# Patient Record
Sex: Female | Born: 1950
Health system: Southern US, Community
[De-identification: ages and names within clinical notes are randomized; demographics above are authoritative.]

## PROBLEM LIST (undated history)

## (undated) DIAGNOSIS — R002 Palpitations: Secondary | ICD-10-CM

## (undated) DIAGNOSIS — I4892 Unspecified atrial flutter: Secondary | ICD-10-CM

## (undated) DIAGNOSIS — C50919 Malignant neoplasm of unspecified site of unspecified female breast: Secondary | ICD-10-CM

## (undated) HISTORY — PX: TONSILLECTOMY: SUR1361

## (undated) HISTORY — DX: Unspecified atrial flutter: I48.92

## (undated) HISTORY — DX: Malignant neoplasm of unspecified site of unspecified female breast: C50.919

## (undated) HISTORY — PX: DILATION AND CURETTAGE OF UTERUS: SHX78

---

## 1999-09-06 ENCOUNTER — Ambulatory Visit (HOSPITAL_BASED_OUTPATIENT_CLINIC_OR_DEPARTMENT_OTHER): Admission: RE | Admit: 1999-09-06 | Discharge: 1999-09-06 | Payer: Self-pay | Admitting: General Surgery

## 1999-09-06 ENCOUNTER — Encounter (INDEPENDENT_AMBULATORY_CARE_PROVIDER_SITE_OTHER): Payer: Self-pay | Admitting: *Deleted

## 1999-11-22 ENCOUNTER — Encounter: Payer: Self-pay | Admitting: Obstetrics and Gynecology

## 1999-11-22 ENCOUNTER — Encounter: Admission: RE | Admit: 1999-11-22 | Discharge: 1999-11-22 | Payer: Self-pay | Admitting: Obstetrics and Gynecology

## 1999-11-29 ENCOUNTER — Encounter: Payer: Self-pay | Admitting: General Surgery

## 1999-11-29 ENCOUNTER — Ambulatory Visit (HOSPITAL_BASED_OUTPATIENT_CLINIC_OR_DEPARTMENT_OTHER): Admission: RE | Admit: 1999-11-29 | Discharge: 1999-11-29 | Payer: Self-pay | Admitting: General Surgery

## 1999-11-29 ENCOUNTER — Encounter (INDEPENDENT_AMBULATORY_CARE_PROVIDER_SITE_OTHER): Payer: Self-pay | Admitting: *Deleted

## 2000-06-07 ENCOUNTER — Encounter: Payer: Self-pay | Admitting: General Surgery

## 2000-06-07 ENCOUNTER — Encounter: Admission: RE | Admit: 2000-06-07 | Discharge: 2000-06-07 | Payer: Self-pay | Admitting: General Surgery

## 2000-06-12 ENCOUNTER — Ambulatory Visit (HOSPITAL_BASED_OUTPATIENT_CLINIC_OR_DEPARTMENT_OTHER): Admission: RE | Admit: 2000-06-12 | Discharge: 2000-06-12 | Payer: Self-pay | Admitting: General Surgery

## 2000-06-12 ENCOUNTER — Encounter (INDEPENDENT_AMBULATORY_CARE_PROVIDER_SITE_OTHER): Payer: Self-pay | Admitting: Specialist

## 2000-06-12 ENCOUNTER — Encounter: Payer: Self-pay | Admitting: General Surgery

## 2000-11-27 ENCOUNTER — Encounter: Payer: Self-pay | Admitting: General Surgery

## 2000-11-27 ENCOUNTER — Encounter: Admission: RE | Admit: 2000-11-27 | Discharge: 2000-11-27 | Payer: Self-pay | Admitting: General Surgery

## 2001-12-10 ENCOUNTER — Encounter: Payer: Self-pay | Admitting: General Surgery

## 2001-12-10 ENCOUNTER — Encounter: Admission: RE | Admit: 2001-12-10 | Discharge: 2001-12-10 | Payer: Self-pay | Admitting: General Surgery

## 2002-02-04 ENCOUNTER — Encounter: Payer: Self-pay | Admitting: General Surgery

## 2002-02-04 ENCOUNTER — Ambulatory Visit (HOSPITAL_COMMUNITY): Admission: RE | Admit: 2002-02-04 | Discharge: 2002-02-04 | Payer: Self-pay | Admitting: General Surgery

## 2002-02-11 ENCOUNTER — Ambulatory Visit (HOSPITAL_COMMUNITY): Admission: RE | Admit: 2002-02-11 | Discharge: 2002-02-11 | Payer: Self-pay | Admitting: General Surgery

## 2002-02-11 ENCOUNTER — Encounter: Payer: Self-pay | Admitting: General Surgery

## 2002-12-23 ENCOUNTER — Encounter: Payer: Self-pay | Admitting: Obstetrics and Gynecology

## 2002-12-23 ENCOUNTER — Encounter: Admission: RE | Admit: 2002-12-23 | Discharge: 2002-12-23 | Payer: Self-pay | Admitting: Obstetrics and Gynecology

## 2004-01-12 ENCOUNTER — Encounter: Admission: RE | Admit: 2004-01-12 | Discharge: 2004-01-12 | Payer: Self-pay | Admitting: General Surgery

## 2004-11-01 ENCOUNTER — Encounter: Admission: RE | Admit: 2004-11-01 | Discharge: 2004-11-01 | Payer: Self-pay | Admitting: General Surgery

## 2005-11-07 ENCOUNTER — Encounter: Admission: RE | Admit: 2005-11-07 | Discharge: 2005-11-07 | Payer: Self-pay | Admitting: General Surgery

## 2006-11-20 ENCOUNTER — Encounter: Admission: RE | Admit: 2006-11-20 | Discharge: 2006-11-20 | Payer: Self-pay | Admitting: General Surgery

## 2008-01-01 ENCOUNTER — Encounter: Admission: RE | Admit: 2008-01-01 | Discharge: 2008-01-01 | Payer: Self-pay | Admitting: Obstetrics and Gynecology

## 2008-10-17 DIAGNOSIS — C50919 Malignant neoplasm of unspecified site of unspecified female breast: Secondary | ICD-10-CM

## 2008-10-17 HISTORY — DX: Malignant neoplasm of unspecified site of unspecified female breast: C50.919

## 2008-10-17 HISTORY — PX: BREAST SURGERY: SHX581

## 2009-01-05 ENCOUNTER — Encounter: Admission: RE | Admit: 2009-01-05 | Discharge: 2009-01-05 | Payer: Self-pay | Admitting: Obstetrics and Gynecology

## 2009-01-14 ENCOUNTER — Encounter: Admission: RE | Admit: 2009-01-14 | Discharge: 2009-01-14 | Payer: Self-pay | Admitting: General Surgery

## 2009-01-19 ENCOUNTER — Encounter (INDEPENDENT_AMBULATORY_CARE_PROVIDER_SITE_OTHER): Payer: Self-pay | Admitting: Radiology

## 2009-01-19 ENCOUNTER — Encounter: Admission: RE | Admit: 2009-01-19 | Discharge: 2009-01-19 | Payer: Self-pay | Admitting: General Surgery

## 2009-01-22 ENCOUNTER — Ambulatory Visit: Payer: Self-pay | Admitting: Genetic Counselor

## 2009-01-22 ENCOUNTER — Ambulatory Visit: Payer: Self-pay | Admitting: Oncology

## 2009-01-27 ENCOUNTER — Encounter: Admission: RE | Admit: 2009-01-27 | Discharge: 2009-01-27 | Payer: Self-pay | Admitting: Obstetrics and Gynecology

## 2009-02-02 ENCOUNTER — Encounter: Admission: RE | Admit: 2009-02-02 | Discharge: 2009-02-02 | Payer: Self-pay | Admitting: General Surgery

## 2009-02-04 ENCOUNTER — Encounter (INDEPENDENT_AMBULATORY_CARE_PROVIDER_SITE_OTHER): Payer: Self-pay | Admitting: General Surgery

## 2009-02-04 ENCOUNTER — Ambulatory Visit (HOSPITAL_BASED_OUTPATIENT_CLINIC_OR_DEPARTMENT_OTHER): Admission: RE | Admit: 2009-02-04 | Discharge: 2009-02-05 | Payer: Self-pay | Admitting: General Surgery

## 2009-02-11 LAB — COMPREHENSIVE METABOLIC PANEL
AST: 20 U/L (ref 0–37)
Albumin: 4 g/dL (ref 3.5–5.2)
BUN: 16 mg/dL (ref 6–23)
Calcium: 9.3 mg/dL (ref 8.4–10.5)
Chloride: 108 mEq/L (ref 96–112)
Creatinine, Ser: 0.66 mg/dL (ref 0.40–1.20)
Glucose, Bld: 118 mg/dL — ABNORMAL HIGH (ref 70–99)
Potassium: 3.9 mEq/L (ref 3.5–5.3)

## 2009-02-11 LAB — CBC WITH DIFFERENTIAL/PLATELET
Basophils Absolute: 0 10*3/uL (ref 0.0–0.1)
EOS%: 1.6 % (ref 0.0–7.0)
Eosinophils Absolute: 0.1 10*3/uL (ref 0.0–0.5)
HCT: 35.7 % (ref 34.8–46.6)
HGB: 12.4 g/dL (ref 11.6–15.9)
MCH: 32 pg (ref 25.1–34.0)
MCV: 91.8 fL (ref 79.5–101.0)
NEUT#: 5.2 10*3/uL (ref 1.5–6.5)
NEUT%: 76.9 % — ABNORMAL HIGH (ref 38.4–76.8)
RDW: 13.3 % (ref 11.2–14.5)
lymph#: 1.1 10*3/uL (ref 0.9–3.3)

## 2009-02-11 LAB — LACTATE DEHYDROGENASE: LDH: 160 U/L (ref 94–250)

## 2009-02-12 LAB — CANCER ANTIGEN 27.29: CA 27.29: 13 U/mL (ref 0–39)

## 2009-02-12 LAB — VITAMIN D 25 HYDROXY (VIT D DEFICIENCY, FRACTURES): Vit D, 25-Hydroxy: 34 ng/mL (ref 30–89)

## 2009-02-17 ENCOUNTER — Encounter: Payer: Self-pay | Admitting: Oncology

## 2009-02-17 ENCOUNTER — Ambulatory Visit (HOSPITAL_COMMUNITY): Admission: RE | Admit: 2009-02-17 | Discharge: 2009-02-17 | Payer: Self-pay | Admitting: Oncology

## 2009-02-20 ENCOUNTER — Ambulatory Visit (HOSPITAL_COMMUNITY): Admission: RE | Admit: 2009-02-20 | Discharge: 2009-02-20 | Payer: Self-pay | Admitting: Oncology

## 2009-02-27 ENCOUNTER — Ambulatory Visit: Payer: Self-pay | Admitting: Oncology

## 2009-03-06 LAB — CBC WITH DIFFERENTIAL/PLATELET
BASO%: 0.6 % (ref 0.0–2.0)
Eosinophils Absolute: 0.1 10*3/uL (ref 0.0–0.5)
MCHC: 34.1 g/dL (ref 31.5–36.0)
MONO#: 0.2 10*3/uL (ref 0.1–0.9)
NEUT#: 0.8 10*3/uL — ABNORMAL LOW (ref 1.5–6.5)
Platelets: 134 10*3/uL — ABNORMAL LOW (ref 145–400)
RBC: 3.58 10*6/uL — ABNORMAL LOW (ref 3.70–5.45)
RDW: 12.6 % (ref 11.2–14.5)
WBC: 1.8 10*3/uL — ABNORMAL LOW (ref 3.9–10.3)
lymph#: 0.7 10*3/uL — ABNORMAL LOW (ref 0.9–3.3)
nRBC: 0 % (ref 0–0)

## 2009-03-13 LAB — CBC WITH DIFFERENTIAL/PLATELET
BASO%: 0.3 % (ref 0.0–2.0)
Basophils Absolute: 0 10*3/uL (ref 0.0–0.1)
HCT: 34.4 % — ABNORMAL LOW (ref 34.8–46.6)
HGB: 11.5 g/dL — ABNORMAL LOW (ref 11.6–15.9)
MONO#: 1.1 10*3/uL — ABNORMAL HIGH (ref 0.1–0.9)
NEUT%: 81.9 % — ABNORMAL HIGH (ref 38.4–76.8)
RDW: 13.3 % (ref 11.2–14.5)
WBC: 11.4 10*3/uL — ABNORMAL HIGH (ref 3.9–10.3)
lymph#: 1 10*3/uL (ref 0.9–3.3)

## 2009-03-20 LAB — CBC WITH DIFFERENTIAL/PLATELET
Basophils Absolute: 0 10*3/uL (ref 0.0–0.1)
Eosinophils Absolute: 0 10*3/uL (ref 0.0–0.5)
HGB: 9.6 g/dL — ABNORMAL LOW (ref 11.6–15.9)
LYMPH%: 35.1 % (ref 14.0–49.7)
MCV: 91.4 fL (ref 79.5–101.0)
MONO%: 16.9 % — ABNORMAL HIGH (ref 0.0–14.0)
NEUT#: 0.6 10*3/uL — ABNORMAL LOW (ref 1.5–6.5)
Platelets: 205 10*3/uL (ref 145–400)
RBC: 3.05 10*6/uL — ABNORMAL LOW (ref 3.70–5.45)

## 2009-03-27 LAB — CBC WITH DIFFERENTIAL/PLATELET
BASO%: 0.3 % (ref 0.0–2.0)
LYMPH%: 7.7 % — ABNORMAL LOW (ref 14.0–49.7)
MCHC: 33.8 g/dL (ref 31.5–36.0)
MCV: 90.8 fL (ref 79.5–101.0)
MONO%: 7.5 % (ref 0.0–14.0)
Platelets: 149 10*3/uL (ref 145–400)
RBC: 3.58 10*6/uL — ABNORMAL LOW (ref 3.70–5.45)
nRBC: 0 % (ref 0–0)

## 2009-04-03 LAB — CBC WITH DIFFERENTIAL/PLATELET
BASO%: 0.8 % (ref 0.0–2.0)
EOS%: 0 % (ref 0.0–7.0)
HCT: 26.4 % — ABNORMAL LOW (ref 34.8–46.6)
MCH: 29.9 pg (ref 25.1–34.0)
MCHC: 33 g/dL (ref 31.5–36.0)
MONO%: 7.9 % (ref 0.0–14.0)
NEUT%: 61.9 % (ref 38.4–76.8)
RDW: 13.9 % (ref 11.2–14.5)
lymph#: 0.4 10*3/uL — ABNORMAL LOW (ref 0.9–3.3)

## 2009-04-08 ENCOUNTER — Ambulatory Visit: Payer: Self-pay | Admitting: Oncology

## 2009-04-10 LAB — CBC WITH DIFFERENTIAL/PLATELET
BASO%: 0.2 % (ref 0.0–2.0)
EOS%: 0 % (ref 0.0–7.0)
Eosinophils Absolute: 0 10*3/uL (ref 0.0–0.5)
MCHC: 33.2 g/dL (ref 31.5–36.0)
MCV: 91.8 fL (ref 79.5–101.0)
MONO%: 9 % (ref 0.0–14.0)
NEUT#: 7.1 10*3/uL — ABNORMAL HIGH (ref 1.5–6.5)
RBC: 3.31 10*6/uL — ABNORMAL LOW (ref 3.70–5.45)
RDW: 15.6 % — ABNORMAL HIGH (ref 11.2–14.5)

## 2009-04-17 LAB — CBC WITH DIFFERENTIAL/PLATELET
Basophils Absolute: 0 10*3/uL (ref 0.0–0.1)
Eosinophils Absolute: 0 10*3/uL (ref 0.0–0.5)
HCT: 25.2 % — ABNORMAL LOW (ref 34.8–46.6)
HGB: 8.9 g/dL — ABNORMAL LOW (ref 11.6–15.9)
LYMPH%: 28.5 % (ref 14.0–49.7)
MCH: 32.8 pg (ref 25.1–34.0)
MCV: 92.7 fL (ref 79.5–101.0)
MONO%: 10.8 % (ref 0.0–14.0)
NEUT#: 0.6 10*3/uL — ABNORMAL LOW (ref 1.5–6.5)
NEUT%: 59.7 % (ref 38.4–76.8)
Platelets: 116 10*3/uL — ABNORMAL LOW (ref 145–400)

## 2009-04-27 LAB — COMPREHENSIVE METABOLIC PANEL
Alkaline Phosphatase: 81 U/L (ref 39–117)
Glucose, Bld: 115 mg/dL — ABNORMAL HIGH (ref 70–99)
Sodium: 140 mEq/L (ref 135–145)
Total Bilirubin: 0.5 mg/dL (ref 0.3–1.2)
Total Protein: 6.2 g/dL (ref 6.0–8.3)

## 2009-04-27 LAB — CBC WITH DIFFERENTIAL/PLATELET
EOS%: 0.1 % (ref 0.0–7.0)
Eosinophils Absolute: 0 10*3/uL (ref 0.0–0.5)
LYMPH%: 9.3 % — ABNORMAL LOW (ref 14.0–49.7)
MCH: 32.8 pg (ref 25.1–34.0)
MCHC: 34.7 g/dL (ref 31.5–36.0)
MCV: 94.5 fL (ref 79.5–101.0)
MONO%: 9.8 % (ref 0.0–14.0)
NEUT#: 2.9 10*3/uL (ref 1.5–6.5)
Platelets: 268 10*3/uL (ref 145–400)
RBC: 3.34 10*6/uL — ABNORMAL LOW (ref 3.70–5.45)

## 2009-05-04 ENCOUNTER — Encounter: Admission: RE | Admit: 2009-05-04 | Discharge: 2009-06-18 | Payer: Self-pay | Admitting: Oncology

## 2009-07-24 ENCOUNTER — Ambulatory Visit: Payer: Self-pay | Admitting: Oncology

## 2009-07-25 IMAGING — CT CT CHEST W/ CM
2 of 3 series · 15 of 36 positions shown, 18 images · IV contrast (agent unspecified)
Comparison: Chest x-ray 02/02/2009

CLINICAL DATA: Breast cancer staging.

CT CHEST WITH CONTRAST
TECHNIQUE: Multidetector CT imaging of the chest was performed
following the standard protocol during bolus administration of
intravenous contrast.
Contrast: 80 ml Xmnipaque-XQQ

[Series 2: chest with st · axial · 0.67mm/px · z∈[-270,+0]mm · 12 of 64 slices shown, 15 images]
[im 5/64  mediastinal]
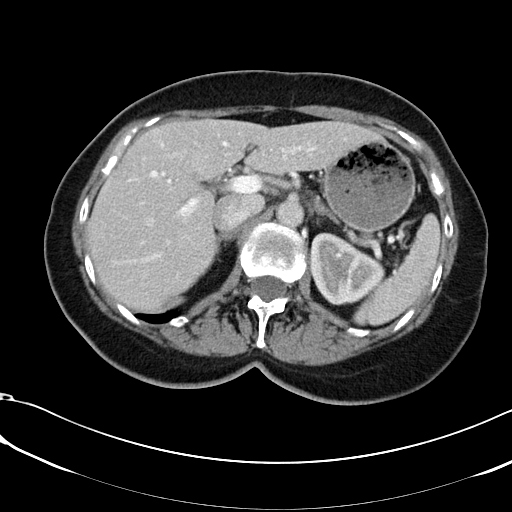
[im 5/64  lung]
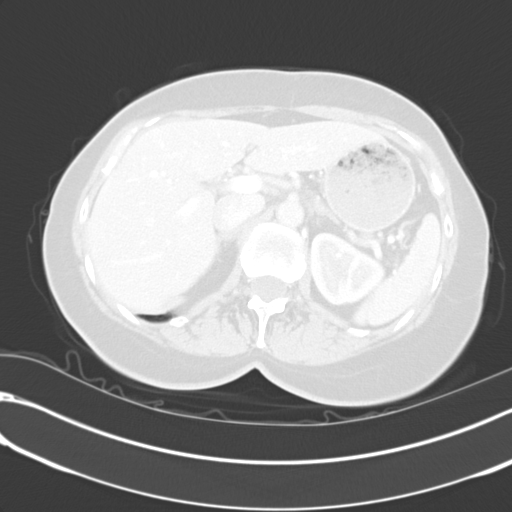
[im 10/64  lung]
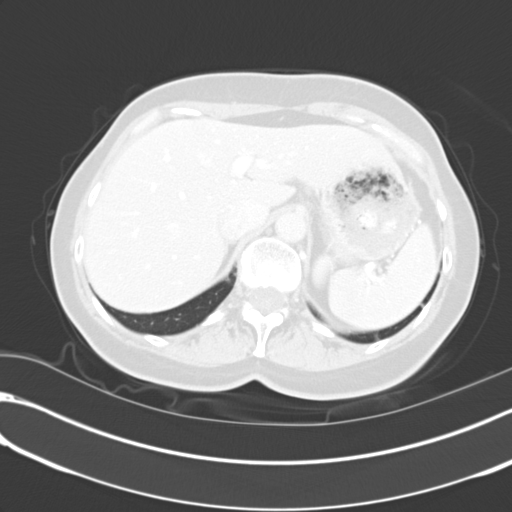
[im 15/64  lung]
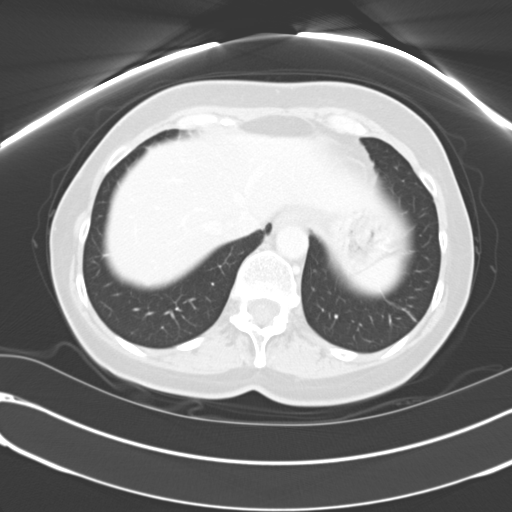
[im 19/64  lung]
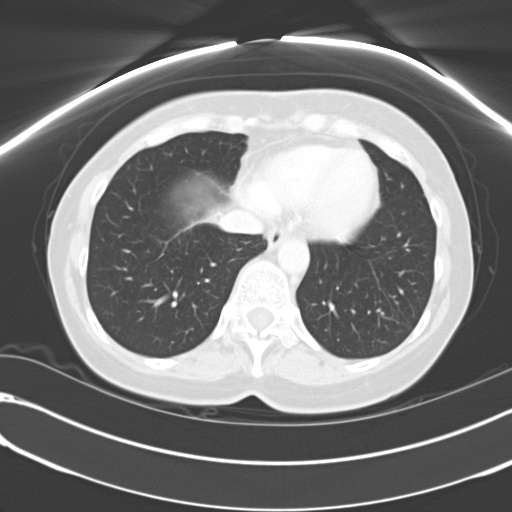
[im 24/64  mediastinal]
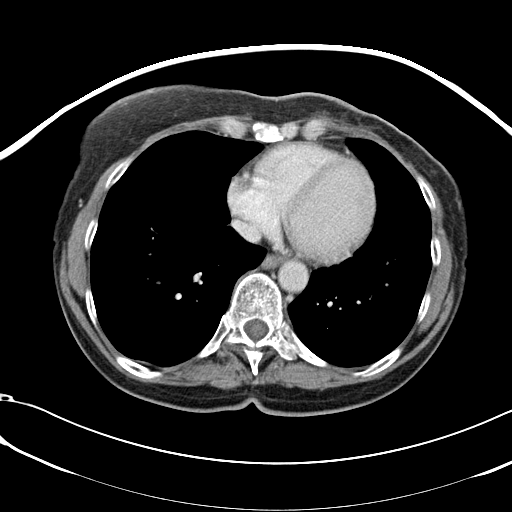
[im 24/64  lung]
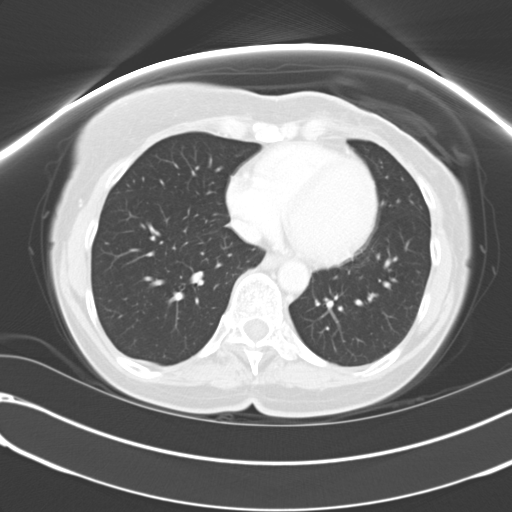
[im 29/64  lung]
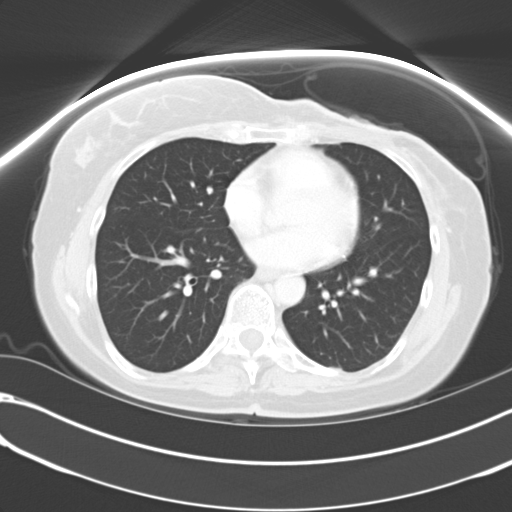
[im 36/64  lung]
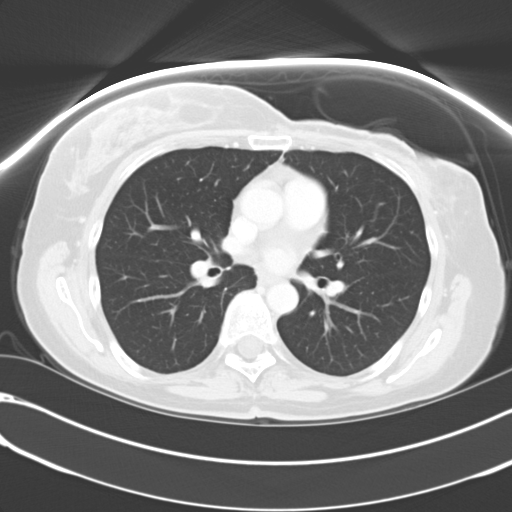
[im 40/64  lung]
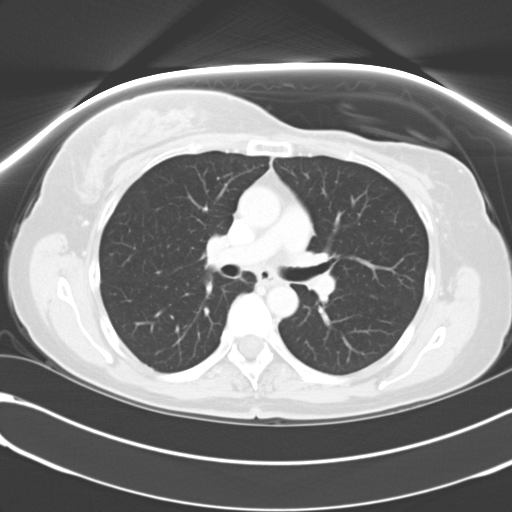
[im 45/64  mediastinal]
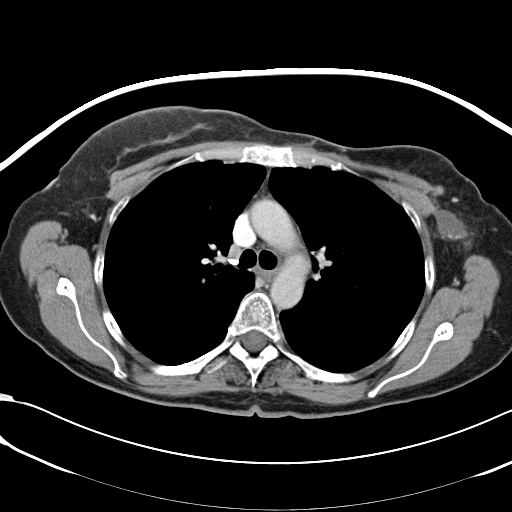
[im 45/64  lung]
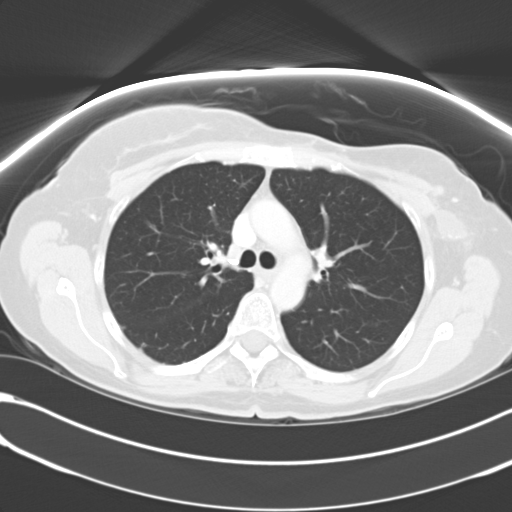
[im 50/64  lung]
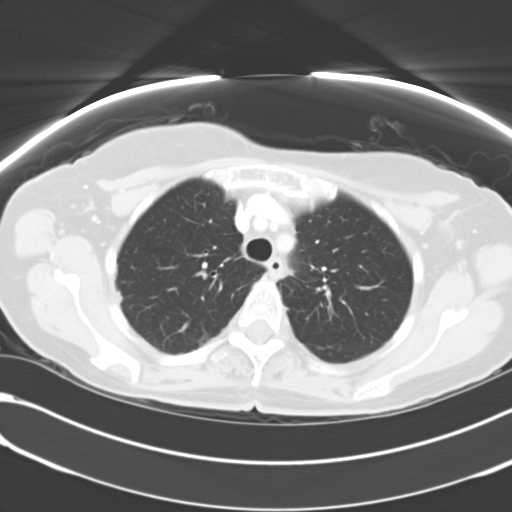
[im 54/64  lung]
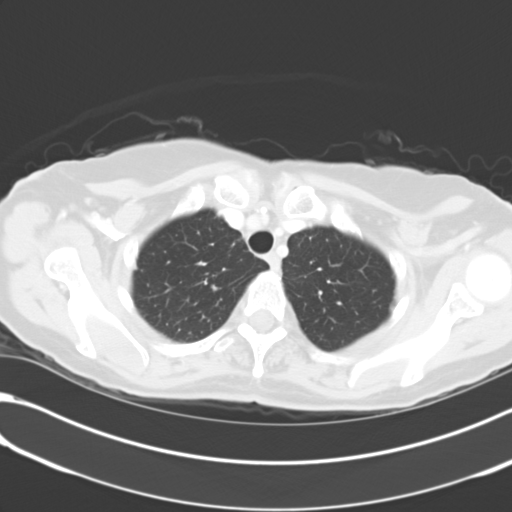
[im 59/64  lung]
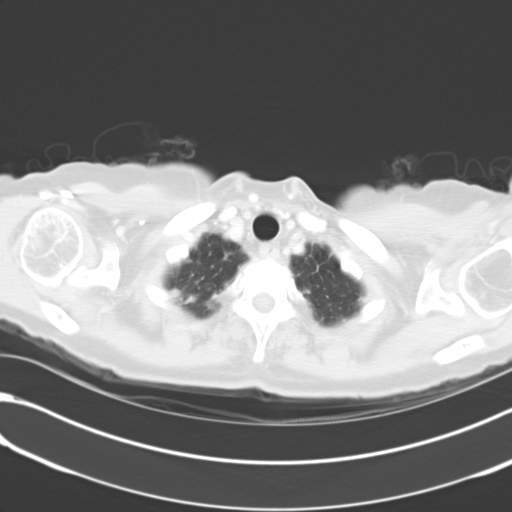

[Series 602: <mpr thick range> · coronal · 0.67mm/px · 3 of 71 slices shown]
[im 15/71  lung]
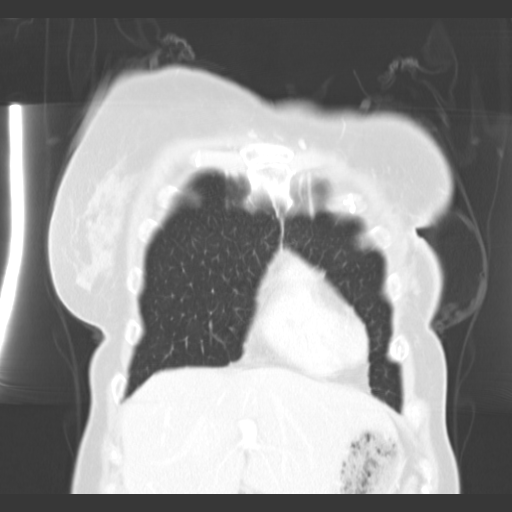
[im 29/71  lung]
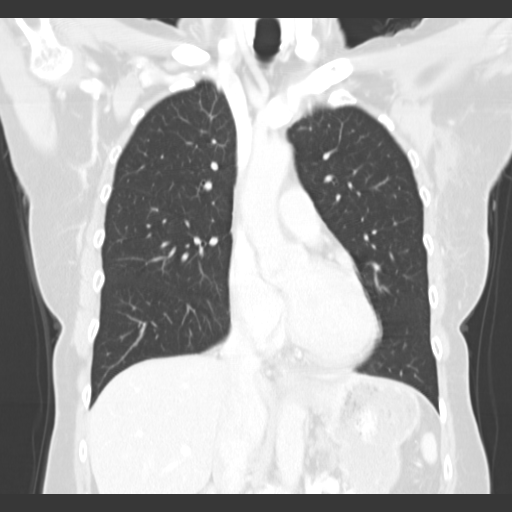
[im 43/71  lung]
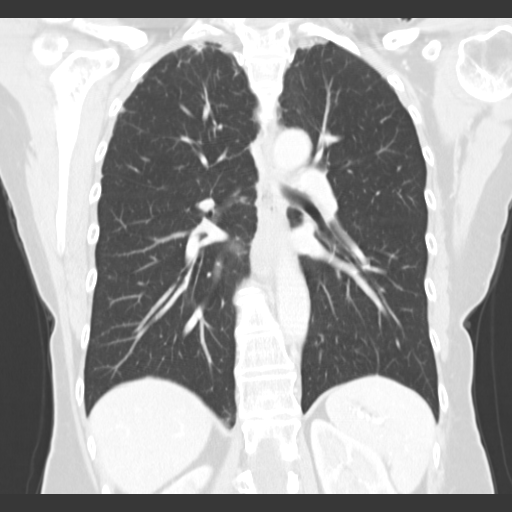

[15 of 36 positions shown; findings below may reference images not displayed]

FINDINGS: No pathologically enlarged mediastinal, hilar, axillary
or internal mammary lymph nodes.  Postoperative changes are seen in
the anterior left chest wall and left axilla.  A small fluid
collection in the left axilla measures 2.7 x 1.8 cm.  Heart size
normal.  No pericardial effusion.

Biapical pleural parenchymal scarring.  Scattered scarring is seen
elsewhere in the lungs.  No worrisome pulmonary nodules.  No
pleural fluid.  Airway is unremarkable.

Incidental imaging of the upper abdomen shows unenlarged lymph
nodes.  No worrisome lytic or sclerotic lesions.
IMPRESSION: Postoperative changes in the anterior left chest wall, with a small
postoperative seroma in the left axilla.  No evidence of metastatic
disease in the chest.

## 2009-07-28 LAB — CBC WITH DIFFERENTIAL/PLATELET
Basophils Absolute: 0 10*3/uL (ref 0.0–0.1)
Eosinophils Absolute: 0 10*3/uL (ref 0.0–0.5)
HCT: 37.7 % (ref 34.8–46.6)
HGB: 13.1 g/dL (ref 11.6–15.9)
NEUT#: 3.5 10*3/uL (ref 1.5–6.5)
NEUT%: 79.6 % — ABNORMAL HIGH (ref 38.4–76.8)
RDW: 13 % (ref 11.2–14.5)
lymph#: 0.6 10*3/uL — ABNORMAL LOW (ref 0.9–3.3)

## 2009-10-27 ENCOUNTER — Ambulatory Visit: Payer: Self-pay | Admitting: Oncology

## 2009-10-27 LAB — CBC WITH DIFFERENTIAL/PLATELET
Eosinophils Absolute: 0.1 10*3/uL (ref 0.0–0.5)
HGB: 12.4 g/dL (ref 11.6–15.9)
MCHC: 34.3 g/dL (ref 31.5–36.0)
MCV: 95.6 fL (ref 79.5–101.0)
MONO#: 0.3 10*3/uL (ref 0.1–0.9)
NEUT%: 81.4 % — ABNORMAL HIGH (ref 38.4–76.8)
RBC: 3.78 10*6/uL (ref 3.70–5.45)
RDW: 14.3 % (ref 11.2–14.5)
lymph#: 0.7 10*3/uL — ABNORMAL LOW (ref 0.9–3.3)

## 2009-10-27 LAB — COMPREHENSIVE METABOLIC PANEL
AST: 36 U/L (ref 0–37)
CO2: 26 mEq/L (ref 19–32)
Calcium: 9.3 mg/dL (ref 8.4–10.5)
Creatinine, Ser: 0.76 mg/dL (ref 0.40–1.20)
Potassium: 4 mEq/L (ref 3.5–5.3)
Total Bilirubin: 0.4 mg/dL (ref 0.3–1.2)

## 2009-11-30 ENCOUNTER — Ambulatory Visit: Payer: Self-pay | Admitting: Oncology

## 2009-12-01 LAB — COMPREHENSIVE METABOLIC PANEL
ALT: 37 U/L — ABNORMAL HIGH (ref 0–35)
AST: 32 U/L (ref 0–37)
Albumin: 4.1 g/dL (ref 3.5–5.2)
Alkaline Phosphatase: 88 U/L (ref 39–117)
BUN: 19 mg/dL (ref 6–23)
CO2: 30 mEq/L (ref 19–32)
Calcium: 9.4 mg/dL (ref 8.4–10.5)
Chloride: 107 mEq/L (ref 96–112)
Creatinine, Ser: 0.7 mg/dL (ref 0.40–1.20)
Glucose, Bld: 102 mg/dL — ABNORMAL HIGH (ref 70–99)
Potassium: 4 mEq/L (ref 3.5–5.3)
Sodium: 142 mEq/L (ref 135–145)
Total Bilirubin: 0.7 mg/dL (ref 0.3–1.2)
Total Protein: 6.5 g/dL (ref 6.0–8.3)

## 2010-01-11 ENCOUNTER — Encounter: Admission: RE | Admit: 2010-01-11 | Discharge: 2010-01-11 | Payer: Self-pay | Admitting: Oncology

## 2010-01-12 ENCOUNTER — Ambulatory Visit (HOSPITAL_COMMUNITY): Admission: RE | Admit: 2010-01-12 | Discharge: 2010-01-12 | Payer: Self-pay | Admitting: Oncology

## 2010-01-28 ENCOUNTER — Ambulatory Visit: Payer: Self-pay | Admitting: Oncology

## 2010-02-01 LAB — CBC WITH DIFFERENTIAL/PLATELET
Eosinophils Absolute: 0.1 10*3/uL (ref 0.0–0.5)
HGB: 13.1 g/dL (ref 11.6–15.9)
MCHC: 34.7 g/dL (ref 31.5–36.0)
MONO#: 0.3 10*3/uL (ref 0.1–0.9)
RBC: 4.02 10*6/uL (ref 3.70–5.45)

## 2010-02-01 LAB — COMPREHENSIVE METABOLIC PANEL
Albumin: 4.5 g/dL (ref 3.5–5.2)
CO2: 26 mEq/L (ref 19–32)
Calcium: 9.1 mg/dL (ref 8.4–10.5)
Creatinine, Ser: 0.64 mg/dL (ref 0.40–1.20)
Glucose, Bld: 116 mg/dL — ABNORMAL HIGH (ref 70–99)
Potassium: 4.1 mEq/L (ref 3.5–5.3)
Sodium: 140 mEq/L (ref 135–145)

## 2010-02-01 LAB — CANCER ANTIGEN 27.29: CA 27.29: 18 U/mL (ref 0–39)

## 2010-07-22 ENCOUNTER — Ambulatory Visit: Payer: Self-pay | Admitting: Oncology

## 2010-07-26 ENCOUNTER — Ambulatory Visit (HOSPITAL_COMMUNITY): Admission: RE | Admit: 2010-07-26 | Discharge: 2010-07-26 | Payer: Self-pay | Admitting: Oncology

## 2010-07-26 LAB — CBC WITH DIFFERENTIAL/PLATELET
HCT: 37.4 % (ref 34.8–46.6)
HGB: 12.9 g/dL (ref 11.6–15.9)
LYMPH%: 17.6 % (ref 14.0–49.7)
MCH: 32.3 pg (ref 25.1–34.0)
MCHC: 34.4 g/dL (ref 31.5–36.0)
MCV: 93.8 fL (ref 79.5–101.0)
NEUT#: 3.3 10*3/uL (ref 1.5–6.5)
Platelets: 213 10*3/uL (ref 145–400)
RBC: 3.99 10*6/uL (ref 3.70–5.45)
lymph#: 0.8 10*3/uL — ABNORMAL LOW (ref 0.9–3.3)

## 2010-07-26 LAB — COMPREHENSIVE METABOLIC PANEL
ALT: 16 U/L (ref 0–35)
Calcium: 9.6 mg/dL (ref 8.4–10.5)
Chloride: 108 mEq/L (ref 96–112)
Glucose, Bld: 96 mg/dL (ref 70–99)
Potassium: 4.4 mEq/L (ref 3.5–5.3)
Sodium: 142 mEq/L (ref 135–145)
Total Protein: 6.5 g/dL (ref 6.0–8.3)

## 2010-07-26 LAB — CANCER ANTIGEN 27.29: CA 27.29: 19 U/mL (ref 0–39)

## 2010-11-07 ENCOUNTER — Encounter: Payer: Self-pay | Admitting: General Surgery

## 2010-12-13 ENCOUNTER — Other Ambulatory Visit: Payer: Self-pay | Admitting: General Surgery

## 2010-12-13 DIAGNOSIS — Z9010 Acquired absence of unspecified breast and nipple: Secondary | ICD-10-CM

## 2010-12-27 ENCOUNTER — Ambulatory Visit: Payer: BC Managed Care – PPO | Attending: General Surgery | Admitting: Physical Therapy

## 2010-12-27 DIAGNOSIS — R0789 Other chest pain: Secondary | ICD-10-CM | POA: Insufficient documentation

## 2010-12-27 DIAGNOSIS — I890 Lymphedema, not elsewhere classified: Secondary | ICD-10-CM | POA: Insufficient documentation

## 2010-12-27 DIAGNOSIS — IMO0001 Reserved for inherently not codable concepts without codable children: Secondary | ICD-10-CM | POA: Insufficient documentation

## 2010-12-27 DIAGNOSIS — M79609 Pain in unspecified limb: Secondary | ICD-10-CM | POA: Insufficient documentation

## 2011-01-17 ENCOUNTER — Ambulatory Visit
Admission: RE | Admit: 2011-01-17 | Discharge: 2011-01-17 | Disposition: A | Discharge status: Home / Self Care | Payer: BC Managed Care – PPO | Source: Ambulatory Visit | Attending: General Surgery | Admitting: General Surgery

## 2011-01-17 DIAGNOSIS — Z9010 Acquired absence of unspecified breast and nipple: Secondary | ICD-10-CM

## 2011-01-26 LAB — CBC
HCT: 38.5 % (ref 36.0–46.0)
Hemoglobin: 13.4 g/dL (ref 12.0–15.0)
Platelets: 202 10*3/uL (ref 150–400)
RDW: 13.4 % (ref 11.5–15.5)

## 2011-01-26 LAB — DIFFERENTIAL
Basophils Absolute: 0 10*3/uL (ref 0.0–0.1)
Eosinophils Relative: 2 % (ref 0–5)
Lymphocytes Relative: 20 % (ref 12–46)
Lymphs Abs: 0.9 10*3/uL (ref 0.7–4.0)
Monocytes Absolute: 0.3 10*3/uL (ref 0.1–1.0)
Neutro Abs: 3.2 10*3/uL (ref 1.7–7.7)

## 2011-01-26 LAB — CEA: CEA: 1.5 ng/mL (ref 0.0–5.0)

## 2011-01-26 LAB — COMPREHENSIVE METABOLIC PANEL
AST: 22 U/L (ref 0–37)
Albumin: 4.2 g/dL (ref 3.5–5.2)
BUN: 16 mg/dL (ref 6–23)
Calcium: 9.6 mg/dL (ref 8.4–10.5)
Chloride: 106 mEq/L (ref 96–112)
Creatinine, Ser: 0.63 mg/dL (ref 0.4–1.2)
GFR calc Af Amer: >60 mL/min (ref >60)
Total Bilirubin: 0.8 mg/dL (ref 0.3–1.2)

## 2011-01-26 LAB — LACTATE DEHYDROGENASE: LDH: 196 U/L (ref 94–250)

## 2011-02-16 ENCOUNTER — Encounter (INDEPENDENT_AMBULATORY_CARE_PROVIDER_SITE_OTHER): Payer: Self-pay | Admitting: General Surgery

## 2011-03-01 NOTE — Op Note (Signed)
NAME:  Candace Wells, Candace Wells NO.:  [id number]   MEDICAL RECORD NO.:  [id number]          PATIENT TYPE:  AMB   LOCATION:  [facility]                          FACILITY:  [facility]   PHYSICIAN:  Juanetta Gosling, MDDATE OF BIRTH:  1950/10/29   DATE OF PROCEDURE:  02/04/2009  DATE OF DISCHARGE:                               OPERATIVE REPORT   PREOPERATIVE DIAGNOSIS:  Left breast cancer.   POSTOPERATIVE DIAGNOSIS:  Left breast cancer.  Her clinical stage is I  prior to operation.   PROCEDURES:  1. Left simple mastectomy.  2. Left axillary sentinel lymph node biopsy.  3. Injection of methylene blue dye for sentinel lymph node biopsy.   SURGEON:  Juanetta Gosling, MD   ASSISTANT:  Almond Lint, MD   ANESTHESIA:  General.   SPECIMENS:  1. Left breast.  2. Sentinel node to pathology.   DRAINS:  Two 19-French Blake drains.   ESTIMATED BLOOD LOSS:  50 mL.   COMPLICATIONS:  None.   DISPOSITION:  To PACU in stable condition.   SUPERVISING ANESTHESIOLOGIST:  Janetta Hora. Gelene Mink, M.D.   INDICATIONS:  Candace Wells is a 60 year old female with a prior history of  a left breast biopsy with atypical ductal hyperplasia by Dr. Francina Ames  in 2001.  Subsequently, she then had a palpable left breast on her own  exam for which she was evaluated at the [facility].  She underwent a mammogram that was normal, but underwent an ultrasound  on the area of the palpable mass that showed a hypoechoic solid mass 1  cm from the nipple measuring about 10 x 5 x 8 mm in size.  She had a  core biopsy and the pathology returned as invasive mammary carcinoma.  She has a significant family history of her mother with a breast cancer  at the age of 16 and multiple aunts with breast cancer as well.  She  underwent an MRI that showed this area to be larger with some non-mass  enhancements surrounding this as well as including the subareolar area.  On her examination, this area  appeared to involve the subareolar area as  well.  I think due to her prior biopsy as well as the area that would  need to be removed to gain clear margins, she and I have decided to do a  left simple mastectomy followed by left axillary sentinel lymph node  biopsy.  She and her husband were both present for the conversation  prior to operation.   PROCEDURE:  After informed consent was obtained, the patient was first  injected with technetium in a periareolar technique.  Following this,  she was taken to the operating room after an appropriate time had passed  and placed under general anesthesia.  Sequential compression devices  were placed in her lower extremities.  Prior to the induction, she was  also administered 1 g of intravenous cefazolin.  Her left breast and the  entire left arm were then prepped and draped in the standard sterile  surgical fashion.  Surgical time-out was then performed.  1 mL of methylene  blue dye was then injected in each cardinal position  around the nipple.  This was then massaged for 2 minutes.  Following  this, I did take the Neoprobe, and I was able to identify what appeared  to be a sentinel node in her axilla.  I then made an elliptical incision including her old incision from her  biopsy as well as her nipple and areolar complex, and carried this down  to the level of breast tissue.  Flaps were then created superiorly at  the level of the clavicle medial to the level of the sternum, inferiorly  to the abdominal musculature, and laterally to the latissimus dorsi.  It  was very difficult to get around her prior biopsy cavity, but the flaps  were viable and adequate at the completion of this.  There were several  pectoral vessels that were suture ligated as well.  The breast was then  removed from the pectoralis muscle to include the pectoralis fascia and  rolled laterally.  This was then disconnected at the latissimus.  The  Neoprobe was then inserted  into the axilla.  The axillary fascia was  opened superiorly.  She was noted to have what appeared to be 2 sentinel  lymph nodes that were hot and blue as well.  These were removed and  passed off the table as the specimen.  The radioactivity counts on this  were about 520.  Following this, the background was about 8 with the  Neoprobe.  Hemostasis was observed at this side, and then closed the  axillary fascia with a 2-0 Vicryl.  The entire wound was then irrigated.  Hemostasis was obtained.  I then inserted two 19-French Blake drains  below her incision and placed these under her mastectomy flaps.  The  dermis was then closed with multiple interrupted 3-0 Vicryl sutures.  The skin was then closed with 4-0 Monocryl in a subcuticular fashion.  Steri-Strips and sterile dressings were placed on this.  The drains were  both charged and working at the completion of the operation.  She  tolerated this well and was extubated in the operating room and  transferred to the recovery room in stable condition.      Juanetta Gosling, MD  Electronically Signed     MC[facility]/MEDQ  D:  02/04/2009  T:  02/05/2009  Job:  [id number]   cc:   Dr. Freda Jackson, Primary Care Physician

## 2011-07-12 ENCOUNTER — Encounter (HOSPITAL_BASED_OUTPATIENT_CLINIC_OR_DEPARTMENT_OTHER): Payer: BC Managed Care – PPO | Admitting: Oncology

## 2011-07-12 ENCOUNTER — Other Ambulatory Visit: Payer: Self-pay | Admitting: Oncology

## 2011-07-12 DIAGNOSIS — Z170 Estrogen receptor positive status [ER+]: Secondary | ICD-10-CM

## 2011-07-12 DIAGNOSIS — C50919 Malignant neoplasm of unspecified site of unspecified female breast: Secondary | ICD-10-CM

## 2011-07-12 LAB — CBC WITH DIFFERENTIAL/PLATELET
BASO%: 0.2 % (ref 0.0–2.0)
EOS%: 1.3 % (ref 0.0–7.0)
MCH: 31.6 pg (ref 25.1–34.0)
MCHC: 34.1 g/dL (ref 31.5–36.0)
MONO#: 0.3 10*3/uL (ref 0.1–0.9)
NEUT%: 78.4 % — ABNORMAL HIGH (ref 38.4–76.8)
RBC: 4.17 10*6/uL (ref 3.70–5.45)
RDW: 13.4 % (ref 11.2–14.5)
WBC: 5.9 10*3/uL (ref 3.9–10.3)
lymph#: 0.9 10*3/uL (ref 0.9–3.3)

## 2011-07-13 LAB — COMPREHENSIVE METABOLIC PANEL
ALT: 31 U/L (ref 0–35)
AST: 29 U/L (ref 0–37)
CO2: 28 mEq/L (ref 19–32)
Calcium: 9.6 mg/dL (ref 8.4–10.5)
Chloride: 103 mEq/L (ref 96–112)
Creatinine, Ser: 0.7 mg/dL (ref 0.50–1.10)
Potassium: 3.6 mEq/L (ref 3.5–5.3)
Sodium: 140 mEq/L (ref 135–145)
Total Protein: 6.7 g/dL (ref 6.0–8.3)

## 2011-07-19 ENCOUNTER — Encounter (HOSPITAL_BASED_OUTPATIENT_CLINIC_OR_DEPARTMENT_OTHER): Payer: BC Managed Care – PPO | Admitting: Oncology

## 2011-07-19 DIAGNOSIS — C50919 Malignant neoplasm of unspecified site of unspecified female breast: Secondary | ICD-10-CM

## 2011-07-19 DIAGNOSIS — Z170 Estrogen receptor positive status [ER+]: Secondary | ICD-10-CM

## 2011-07-19 DIAGNOSIS — M899 Disorder of bone, unspecified: Secondary | ICD-10-CM

## 2011-07-20 ENCOUNTER — Encounter: Payer: Self-pay | Admitting: Gastroenterology

## 2011-07-20 ENCOUNTER — Other Ambulatory Visit: Payer: Self-pay | Admitting: Oncology

## 2011-07-20 DIAGNOSIS — Z1231 Encounter for screening mammogram for malignant neoplasm of breast: Secondary | ICD-10-CM

## 2011-08-15 ENCOUNTER — Ambulatory Visit: Payer: BC Managed Care – PPO | Admitting: Gastroenterology

## 2011-09-20 ENCOUNTER — Telehealth: Payer: Self-pay | Admitting: *Deleted

## 2011-09-20 NOTE — Telephone Encounter (Signed)
patient confirmed over the phone the new date and time of the 2013 appointment 

## 2011-11-21 ENCOUNTER — Ambulatory Visit (INDEPENDENT_AMBULATORY_CARE_PROVIDER_SITE_OTHER): Payer: BC Managed Care – PPO | Admitting: General Surgery

## 2011-11-21 ENCOUNTER — Encounter (INDEPENDENT_AMBULATORY_CARE_PROVIDER_SITE_OTHER): Payer: Self-pay | Admitting: General Surgery

## 2011-11-21 VITALS — BP 118/76 | HR 100 | Temp 98.2°F | Resp 16 | Ht 64.0 in | Wt 136.2 lb

## 2011-11-21 DIAGNOSIS — C50919 Malignant neoplasm of unspecified site of unspecified female breast: Secondary | ICD-10-CM

## 2011-11-21 DIAGNOSIS — C50112 Malignant neoplasm of central portion of left female breast: Secondary | ICD-10-CM | POA: Insufficient documentation

## 2011-11-21 DIAGNOSIS — Z170 Estrogen receptor positive status [ER+]: Secondary | ICD-10-CM | POA: Insufficient documentation

## 2011-11-21 DIAGNOSIS — Z853 Personal history of malignant neoplasm of breast: Secondary | ICD-10-CM

## 2011-11-21 NOTE — Progress Notes (Signed)
Subjective:     Patient ID: Candace Wells, female   DOB: 04-01-1951, 61 y.o.   MRN: [id number]  HPI This is a 61 year old female who in April of 2010 underwent a left mastectomy and sentinel node biopsy for a stage I ER positive tumor at 5%, PR-negative tumor. She was then treated with chemotherapy. She was begun on tamoxifen but didn't tolerate this due to side effects. She has done well over the last year. Her last mammogram in February 2012 on the right was negative and she is due to get another one in March. She has had some significant left chest wall hyperesthesias without any real solution to this. We've tried most everything at this point. She is now on amitriptyline and this helps. She comes in today for an exam.  Review of Systems DG SCREENING MAMMOGRAM RIGHT  CC and MLO view(s) were taken of the right breast.  Technologist: Cato Mulligan, RT, RM  DIGITAL SCREENING MAMMOGRAM WITH CAD:  The breast tissue is heterogeneously dense. No masses or malignant type calcifications are  identified. Compared with prior studies.  Images were processed with CAD.  IMPRESSION:  No specific mammographic evidence of malignancy. Next screening mammogram is recommended in one  year.  A result letter of this screening mammogram will be mailed directly to the patient.  ASSESSMENT: Negative - BI-RADS 1    Objective:   Physical Exam  Vitals reviewed. Neck: Neck supple.  Pulmonary/Chest: Right breast exhibits no inverted nipple, no mass, no nipple discharge, no skin change and no tenderness. Left breast exhibits tenderness (lateral portion of incision and in axilla). Left breast exhibits no mass.    Lymphadenopathy:    She has no cervical adenopathy.    She has no axillary adenopathy.       Right: No supraclavicular adenopathy present.       Left: No supraclavicular adenopathy present.       Assessment:     History of stage I left breast cancer    Plan:        She has no clinical  evidence of recurrence on her exam today. She is going to get her mammogram in March as scheduled. We discussed continuing her own self exams also. I don't think there is really any other options for that continued pain she hasn't mostly just when she wears a bra. We discussed all of those options. We've attempted them and have not really been successful. She is comfortable where we are right now. I told her that she still easy to examine every 6 months. If she is seeing Dr. Jennette Kettle and Dr. Darnelle Catalan and I told her that she doesn't have to see me. I offered to see her annually would be happy to do that.

## 2012-01-19 ENCOUNTER — Ambulatory Visit: Payer: BC Managed Care – PPO

## 2012-01-23 ENCOUNTER — Ambulatory Visit
Admission: RE | Admit: 2012-01-23 | Discharge: 2012-01-23 | Disposition: A | Discharge status: Home / Self Care | Payer: BC Managed Care – PPO | Source: Ambulatory Visit | Attending: Oncology | Admitting: Oncology

## 2012-01-23 DIAGNOSIS — Z1231 Encounter for screening mammogram for malignant neoplasm of breast: Secondary | ICD-10-CM

## 2012-01-25 ENCOUNTER — Other Ambulatory Visit: Payer: Self-pay | Admitting: Oncology

## 2012-01-25 DIAGNOSIS — R928 Other abnormal and inconclusive findings on diagnostic imaging of breast: Secondary | ICD-10-CM

## 2012-01-26 ENCOUNTER — Ambulatory Visit
Admission: RE | Admit: 2012-01-26 | Discharge: 2012-01-26 | Disposition: A | Discharge status: Home / Self Care | Payer: BC Managed Care – PPO | Source: Ambulatory Visit | Attending: Oncology | Admitting: Oncology

## 2012-01-26 ENCOUNTER — Other Ambulatory Visit: Payer: Self-pay | Admitting: Oncology

## 2012-01-26 DIAGNOSIS — R928 Other abnormal and inconclusive findings on diagnostic imaging of breast: Secondary | ICD-10-CM

## 2012-02-02 ENCOUNTER — Ambulatory Visit
Admission: RE | Admit: 2012-02-02 | Discharge: 2012-02-02 | Disposition: A | Discharge status: Home / Self Care | Payer: BC Managed Care – PPO | Source: Ambulatory Visit | Attending: Oncology | Admitting: Oncology

## 2012-02-02 ENCOUNTER — Telehealth (INDEPENDENT_AMBULATORY_CARE_PROVIDER_SITE_OTHER): Payer: Self-pay | Admitting: Surgery

## 2012-02-02 ENCOUNTER — Other Ambulatory Visit: Payer: Self-pay | Admitting: Oncology

## 2012-02-02 DIAGNOSIS — R928 Other abnormal and inconclusive findings on diagnostic imaging of breast: Secondary | ICD-10-CM

## 2012-02-02 DIAGNOSIS — R921 Mammographic calcification found on diagnostic imaging of breast: Secondary | ICD-10-CM

## 2012-02-02 NOTE — Telephone Encounter (Signed)
Spoke with patient, aware 03/02/12 is first available with Dr Jamey Ripa. She will call if she wants to see another surgeon in the practice.

## 2012-02-03 ENCOUNTER — Telehealth (INDEPENDENT_AMBULATORY_CARE_PROVIDER_SITE_OTHER): Payer: Self-pay

## 2012-02-03 NOTE — Telephone Encounter (Signed)
The patient called and states she has a new breast lump.  The breast center tried to do a needle bx and couldn't.  She needs it excised.  I notice she has an appt with Dr Jamey Ripa May 17th but she is Dr Doreen Salvage pt and she wants to talk to [facility] to try to move up sooner with Dr Dwain Sarna.

## 2012-02-08 ENCOUNTER — Encounter (INDEPENDENT_AMBULATORY_CARE_PROVIDER_SITE_OTHER): Payer: Self-pay | Admitting: General Surgery

## 2012-02-08 ENCOUNTER — Ambulatory Visit (INDEPENDENT_AMBULATORY_CARE_PROVIDER_SITE_OTHER): Payer: BC Managed Care – PPO | Admitting: General Surgery

## 2012-02-08 ENCOUNTER — Other Ambulatory Visit (INDEPENDENT_AMBULATORY_CARE_PROVIDER_SITE_OTHER): Payer: Self-pay | Admitting: General Surgery

## 2012-02-08 DIAGNOSIS — R928 Other abnormal and inconclusive findings on diagnostic imaging of breast: Secondary | ICD-10-CM

## 2012-02-08 NOTE — Progress Notes (Signed)
Patient ID: Candace Wells, female   DOB: 01/13/1951, 61 y.o.   MRN: [id number]  Chief Complaint  Patient presents with  . Follow-up    exc bx of right breast    HPI Candace Wells is a 61 y.o. female.   HPI This is a 61-year-old female who I know well from a left mastectomy and sentinel lymph node biopsy for a stage I left breast cancer. She was also treated with chemotherapy. She didn't tolerate her anti-estrogen therapy but her estrogen receptor was positive only at 5%. 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 She is on Elavil for this now and the only real solution is that she does not wear a bra on this side. She got her routine screening mammogram on the right side recently and it showed a small area of posterior calcifications that are not amenable to stereotactic biopsy. She comes in today without any complaints referable to her right breast to discuss a biopsy.  Past Medical History  Diagnosis Date  . Osteoporosis   . Cancer 2010     lt BREAST    Past Surgical History  Procedure Date  . Breast surgery 2010    left mastectomy and snbx    Family History  Problem Relation Age of Onset  . Cancer Mother     breast  . Cancer Father     colon  . Hypertension Father   . Hypertension Maternal Grandmother   . Heart disease Maternal Grandfather     Social History History  Substance Use Topics  . Smoking status: Never Smoker   . Smokeless tobacco: Not on file  . Alcohol Use: No    No Known Allergies  Current Outpatient Prescriptions  Medication Sig Dispense Refill  . amitriptyline (ELAVIL) 10 MG tablet Take 10 mg by mouth at bedtime.      . Calcium Carbonate-Vitamin D (CALCIUM + D PO) Take by mouth daily.      . Omega-3 Fatty Acids (FISH OIL PO) Take by mouth daily.        Review of Systems Review of Systems  Blood pressure 122/70, pulse 80, temperature 98 F (36.7 C),  temperature source Temporal, resp. rate 16, height 5' 4" (1.626 m), weight 137 lb 9.6 oz (62.415 kg).  Physical Exam Physical Exam  Vitals reviewed. Constitutional: She appears well-developed and well-nourished.  Neck: Neck supple.  Cardiovascular: Normal rate, regular rhythm and normal heart sounds.   Pulmonary/Chest: Effort normal and breath sounds normal. Right breast exhibits no inverted nipple, no mass, no nipple discharge, no skin change and no tenderness.    Lymphadenopathy:    She has no cervical adenopathy.    She has no axillary adenopathy.       Right: No supraclavicular adenopathy present.       Left: No supraclavicular adenopathy present.    Data Reviewed DIGITAL DIAGNOSTIC RIGHT MAMMOGRAM WITHOUT CAD  Comparison: Multiple priors  Findings: The patient has a scar in the upper-outer quadrant of  the right breast, posteriorly in the area of previously described  calcifications. The calcifications are localized in the spot  tangential view is done. However, calcifications are not in the  skin.  An attempt was made to do a stereotactic biopsy. However, the  calcifications are too far posterior and cannot be targeted.  Excisional biopsy is recommended. A surgical consultation is made  with Dr. Streck Friday, May   17 that 1040 am at the patients  request.  IMPRESSION:  Calcifications, right breast which cannot be targeted  stereotactially. Excisional biopsy is recommended.  BI-RADS CATEGORY 4: Suspicious abnormality - biopsy should be  considered.   Assessment    Right breast mammographic abnormality    Plan    She very much would like to have a mastectomy on the right side. She has a history of breast cancer on the left side as well as a significant family history also. I think that that would be a reasonable option at this point as well. She desires a prophylactic mastectomy. I discussed with her that I do not think that that would be the best plan initially. I am  concerned about her postmastectomy pain syndrome on the left. If we're to proceed with a mastectomy right now if this is cancer we would have to go back and assess her lymph nodes and this would likely need be an axillary dissection. Either that or we would just leave it and follow her which I'm concerned about in a young healthy woman. The other option would be to do a sentinel lymph node at the time of the mastectomy but there is a very good chance this will be completely unnecessary. I think she is likely at higher risk than normal for PMPS but there is not a lot of data to support this one way or another. We discussed this at length today. I think the best option given the fact that she cannot have a core biopsy by radiology according to them would be to proceed with a right breast wire localization biopsy. Once we get the results back i would be an additional surgery but we could then proceed with a prophylactic or therapeutic mastectomy. We discussed the risks and benefits of a wire localization biopsy.       Kerrick Miler 02/08/2012, 9:24 AM    

## 2012-02-14 ENCOUNTER — Other Ambulatory Visit: Payer: Self-pay | Admitting: Oncology

## 2012-02-24 ENCOUNTER — Encounter (HOSPITAL_BASED_OUTPATIENT_CLINIC_OR_DEPARTMENT_OTHER): Payer: Self-pay | Admitting: *Deleted

## 2012-02-24 NOTE — Progress Notes (Signed)
To come in for labs  

## 2012-02-27 ENCOUNTER — Encounter (HOSPITAL_BASED_OUTPATIENT_CLINIC_OR_DEPARTMENT_OTHER)
Admission: RE | Admit: 2012-02-27 | Discharge: 2012-02-27 | Disposition: A | Discharge status: Home / Self Care | Payer: BC Managed Care – PPO | Source: Ambulatory Visit | Attending: General Surgery | Admitting: General Surgery

## 2012-02-27 LAB — BASIC METABOLIC PANEL
BUN: 18 mg/dL (ref 6–23)
Calcium: 9.9 mg/dL (ref 8.4–10.5)
Creatinine, Ser: 0.66 mg/dL (ref 0.50–1.10)
GFR calc non Af Amer: >90 mL/min (ref >90)
Glucose, Bld: 91 mg/dL (ref 70–99)
Sodium: 142 mEq/L (ref 135–145)

## 2012-02-27 LAB — CBC
MCH: 31.3 pg (ref 26.0–34.0)
MCHC: 33.8 g/dL (ref 30.0–36.0)
Platelets: 221 10*3/uL (ref 150–400)
RBC: 4.16 MIL/uL (ref 3.87–5.11)
RDW: 13.1 % (ref 11.5–15.5)

## 2012-02-28 ENCOUNTER — Encounter (HOSPITAL_BASED_OUTPATIENT_CLINIC_OR_DEPARTMENT_OTHER): Payer: Self-pay | Admitting: Anesthesiology

## 2012-02-28 ENCOUNTER — Ambulatory Visit (HOSPITAL_BASED_OUTPATIENT_CLINIC_OR_DEPARTMENT_OTHER): Payer: BC Managed Care – PPO | Admitting: Anesthesiology

## 2012-02-28 ENCOUNTER — Encounter (HOSPITAL_BASED_OUTPATIENT_CLINIC_OR_DEPARTMENT_OTHER): Payer: Self-pay | Admitting: *Deleted

## 2012-02-28 ENCOUNTER — Ambulatory Visit
Admission: RE | Admit: 2012-02-28 | Discharge: 2012-02-28 | Disposition: A | Discharge status: Home / Self Care | Payer: BC Managed Care – PPO | Source: Ambulatory Visit | Attending: General Surgery | Admitting: General Surgery

## 2012-02-28 ENCOUNTER — Encounter (HOSPITAL_BASED_OUTPATIENT_CLINIC_OR_DEPARTMENT_OTHER)
Admission: RE | Disposition: A | Discharge status: Home / Self Care | Payer: Self-pay | Source: Ambulatory Visit | Attending: General Surgery

## 2012-02-28 ENCOUNTER — Ambulatory Visit (HOSPITAL_BASED_OUTPATIENT_CLINIC_OR_DEPARTMENT_OTHER)
Admission: RE | Admit: 2012-02-28 | Discharge: 2012-02-28 | Disposition: A | Discharge status: Home / Self Care | Payer: BC Managed Care – PPO | Source: Ambulatory Visit | Attending: General Surgery | Admitting: General Surgery

## 2012-02-28 DIAGNOSIS — D0590 Unspecified type of carcinoma in situ of unspecified breast: Secondary | ICD-10-CM

## 2012-02-28 DIAGNOSIS — Z9010 Acquired absence of unspecified breast and nipple: Secondary | ICD-10-CM | POA: Insufficient documentation

## 2012-02-28 DIAGNOSIS — M810 Age-related osteoporosis without current pathological fracture: Secondary | ICD-10-CM | POA: Insufficient documentation

## 2012-02-28 DIAGNOSIS — R928 Other abnormal and inconclusive findings on diagnostic imaging of breast: Secondary | ICD-10-CM | POA: Insufficient documentation

## 2012-02-28 DIAGNOSIS — Z853 Personal history of malignant neoplasm of breast: Secondary | ICD-10-CM | POA: Insufficient documentation

## 2012-02-28 HISTORY — PX: BREAST BIOPSY: SHX20

## 2012-02-28 SURGERY — BREAST BIOPSY WITH NEEDLE LOCALIZATION
Anesthesia: General | Site: Breast | Laterality: Right | Wound class: Clean

## 2012-02-28 MED ORDER — CEFAZOLIN SODIUM 1-5 GM-% IV SOLN
1.0000 g | INTRAVENOUS | Status: AC
  Administered 2012-02-28: 1 g via INTRAVENOUS

## 2012-02-28 MED ORDER — ACETAMINOPHEN 10 MG/ML IV SOLN
1000.0000 mg | Freq: Once | INTRAVENOUS | Status: AC
  Administered 2012-02-28: 1000 mg via INTRAVENOUS

## 2012-02-28 MED ORDER — METOCLOPRAMIDE HCL 5 MG/ML IJ SOLN: 10.0000 mg | Freq: Once | INTRAMUSCULAR | Status: DC | PRN

## 2012-02-28 MED ORDER — LACTATED RINGERS IV SOLN
INTRAVENOUS | Status: DC
  Administered 2012-02-28: 09:00:00 via INTRAVENOUS

## 2012-02-28 MED ORDER — PROPOFOL 10 MG/ML IV EMUL
INTRAVENOUS | Status: DC | PRN
  Administered 2012-02-28: 200 mg via INTRAVENOUS

## 2012-02-28 MED ORDER — OXYCODONE-ACETAMINOPHEN 5-325 MG PO TABS: 1.0000 | ORAL_TABLET | ORAL | Status: AC | PRN

## 2012-02-28 MED ORDER — HYDROMORPHONE HCL PF 1 MG/ML IJ SOLN: 0.2500 mg | INTRAMUSCULAR | Status: DC | PRN

## 2012-02-28 MED ORDER — EPHEDRINE SULFATE 50 MG/ML IJ SOLN
INTRAMUSCULAR | Status: DC | PRN
  Administered 2012-02-28 (×2): 15 mg via INTRAVENOUS

## 2012-02-28 MED ORDER — LIDOCAINE HCL (CARDIAC) 20 MG/ML IV SOLN
INTRAVENOUS | Status: DC | PRN
  Administered 2012-02-28: 50 mg via INTRAVENOUS

## 2012-02-28 MED ORDER — MIDAZOLAM HCL 5 MG/5ML IJ SOLN
INTRAMUSCULAR | Status: DC | PRN
  Administered 2012-02-28: 1 mg via INTRAVENOUS

## 2012-02-28 MED ORDER — BUPIVACAINE HCL (PF) 0.25 % IJ SOLN
INTRAMUSCULAR | Status: DC | PRN
  Administered 2012-02-28: 20 mL

## 2012-02-28 MED ORDER — ONDANSETRON HCL 4 MG/2ML IJ SOLN
INTRAMUSCULAR | Status: DC | PRN
  Administered 2012-02-28: 4 mg via INTRAVENOUS

## 2012-02-28 MED ORDER — DEXAMETHASONE SODIUM PHOSPHATE 4 MG/ML IJ SOLN
INTRAMUSCULAR | Status: DC | PRN
  Administered 2012-02-28: 10 mg via INTRAVENOUS

## 2012-02-28 MED ORDER — DROPERIDOL 2.5 MG/ML IJ SOLN
INTRAMUSCULAR | Status: DC | PRN
  Administered 2012-02-28: 0.625 mg via INTRAVENOUS

## 2012-02-28 MED ORDER — FENTANYL CITRATE 0.05 MG/ML IJ SOLN
INTRAMUSCULAR | Status: DC | PRN
  Administered 2012-02-28: 50 ug via INTRAVENOUS

## 2012-02-28 SURGICAL SUPPLY — 54 items
ADH SKN CLS APL DERMABOND .7 (GAUZE/BANDAGES/DRESSINGS)
APL SKNCLS STERI-STRIP NONHPOA (GAUZE/BANDAGES/DRESSINGS) ×1
APPLIER CLIP 9.375 MED OPEN (MISCELLANEOUS)
APR CLP MED 9.3 20 MLT OPN (MISCELLANEOUS)
BENZOIN TINCTURE PRP APPL 2/3 (GAUZE/BANDAGES/DRESSINGS) ×2 IMPLANT
BINDER BREAST LRG (GAUZE/BANDAGES/DRESSINGS) IMPLANT
BINDER BREAST MEDIUM (GAUZE/BANDAGES/DRESSINGS) IMPLANT
BINDER BREAST XLRG (GAUZE/BANDAGES/DRESSINGS) IMPLANT
BINDER BREAST XXLRG (GAUZE/BANDAGES/DRESSINGS) IMPLANT
BLADE SURG 15 STRL LF DISP TIS (BLADE) ×1 IMPLANT
BLADE SURG 15 STRL SS (BLADE) ×2
CANISTER SUCTION 1200CC (MISCELLANEOUS) IMPLANT
CHLORAPREP W/TINT 26ML (MISCELLANEOUS) ×2 IMPLANT
CLIP APPLIE 9.375 MED OPEN (MISCELLANEOUS) IMPLANT
CLOTH BEACON ORANGE TIMEOUT ST (SAFETY) ×2 IMPLANT
COVER MAYO STAND STRL (DRAPES) ×2 IMPLANT
COVER TABLE BACK 60X90 (DRAPES) ×2 IMPLANT
DECANTER SPIKE VIAL GLASS SM (MISCELLANEOUS) IMPLANT
DERMABOND ADVANCED (GAUZE/BANDAGES/DRESSINGS)
DERMABOND ADVANCED .7 DNX12 (GAUZE/BANDAGES/DRESSINGS) IMPLANT
DEVICE DUBIN W/COMP PLATE 8390 (MISCELLANEOUS) ×1 IMPLANT
DRAPE PED LAPAROTOMY (DRAPES) ×2 IMPLANT
DRSG TEGADERM 4X4.75 (GAUZE/BANDAGES/DRESSINGS) ×2 IMPLANT
ELECT COATED BLADE 2.86 ST (ELECTRODE) ×2 IMPLANT
ELECT REM PT RETURN 9FT ADLT (ELECTROSURGICAL) ×2
ELECTRODE REM PT RTRN 9FT ADLT (ELECTROSURGICAL) ×1 IMPLANT
GAUZE SPONGE 4X4 12PLY STRL LF (GAUZE/BANDAGES/DRESSINGS) ×2 IMPLANT
GLOVE BIO SURGEON STRL SZ7 (GLOVE) ×2 IMPLANT
GLOVE BIOGEL PI IND STRL 7.5 (GLOVE) ×1 IMPLANT
GLOVE BIOGEL PI INDICATOR 7.5 (GLOVE) ×1
GOWN PREVENTION PLUS XLARGE (GOWN DISPOSABLE) ×2 IMPLANT
NDL HYPO 25X1 1.5 SAFETY (NEEDLE) ×1 IMPLANT
NEEDLE HYPO 25X1 1.5 SAFETY (NEEDLE) ×2 IMPLANT
NS IRRIG 1000ML POUR BTL (IV SOLUTION) ×1 IMPLANT
PACK BASIN DAY SURGERY FS (CUSTOM PROCEDURE TRAY) ×2 IMPLANT
PENCIL BUTTON HOLSTER BLD 10FT (ELECTRODE) ×2 IMPLANT
SLEEVE SCD COMPRESS KNEE MED (MISCELLANEOUS) ×2 IMPLANT
SPONGE GAUZE 4X4 12PLY (GAUZE/BANDAGES/DRESSINGS) ×1 IMPLANT
SPONGE LAP 4X18 X RAY DECT (DISPOSABLE) ×2 IMPLANT
STRIP CLOSURE SKIN 1/2X4 (GAUZE/BANDAGES/DRESSINGS) ×2 IMPLANT
SUT MNCRL AB 4-0 PS2 18 (SUTURE) ×2 IMPLANT
SUT SILK 2 0 SH (SUTURE) ×2 IMPLANT
SUT VIC AB 2-0 SH 27 (SUTURE) ×2
SUT VIC AB 2-0 SH 27XBRD (SUTURE) ×1 IMPLANT
SUT VIC AB 3-0 SH 27 (SUTURE) ×2
SUT VIC AB 3-0 SH 27X BRD (SUTURE) ×1 IMPLANT
SUT VICRYL AB 3 0 TIES (SUTURE) IMPLANT
SYR CONTROL 10ML LL (SYRINGE) ×2 IMPLANT
TAPE CLOTH SURG 4X10 WHT LF (GAUZE/BANDAGES/DRESSINGS) ×1 IMPLANT
TOWEL OR 17X24 6PK STRL BLUE (TOWEL DISPOSABLE) ×2 IMPLANT
TOWEL OR NON WOVEN STRL DISP B (DISPOSABLE) ×2 IMPLANT
TUBE CONNECTING 20X1/4 (TUBING) IMPLANT
WATER STERILE IRR 1000ML POUR (IV SOLUTION) ×1 IMPLANT
YANKAUER SUCT BULB TIP NO VENT (SUCTIONS) IMPLANT

## 2012-02-28 NOTE — Anesthesia Preprocedure Evaluation (Signed)
Anesthesia Evaluation  Patient identified by MRN, date of birth, ID band Patient awake    Reviewed: Allergy & Precautions, H&P , NPO status , Patient's Chart, lab work & pertinent test results, reviewed documented beta blocker date and time   Airway Mallampati: II TM Distance: >3 FB Neck ROM: full    Dental   Pulmonary neg pulmonary ROS,          Cardiovascular negative cardio ROS      Neuro/Psych negative neurological ROS  negative psych ROS   GI/Hepatic negative GI ROS, Neg liver ROS,   Endo/Other  negative endocrine ROS  Renal/GU negative Renal ROS  negative genitourinary   Musculoskeletal   Abdominal   Peds  Hematology negative hematology ROS (+)   Anesthesia Other Findings See surgeon's H&P   Reproductive/Obstetrics negative OB ROS                           Anesthesia Physical Anesthesia Plan  ASA: II  Anesthesia Plan: General   Post-op Pain Management:    Induction: Intravenous  Airway Management Planned: LMA  Additional Equipment:   Intra-op Plan:   Post-operative Plan: Extubation in OR  Informed Consent: I have reviewed the patients History and Physical, chart, labs and discussed the procedure including the risks, benefits and alternatives for the proposed anesthesia with the patient or authorized representative who has indicated his/her understanding and acceptance.   Dental Advisory Given  Plan Discussed with: CRNA and Surgeon  Anesthesia Plan Comments:         Anesthesia Quick Evaluation  

## 2012-02-28 NOTE — Anesthesia Postprocedure Evaluation (Signed)
Anesthesia Post Note  Patient: Candace Wells  Procedure(s) Performed: Procedure(s) (LRB): BREAST BIOPSY WITH NEEDLE LOCALIZATION (Right)  Anesthesia type: General  Patient location: PACU  Post pain: Pain level controlled  Post assessment: Patient's Cardiovascular Status Stable  Last Vitals:  Filed Vitals:   02/28/12 1202  BP: 104/65  Pulse: 80  Temp: 36.3 C  Resp: 18    Post vital signs: Reviewed and stable  Level of consciousness: alert  Complications: No apparent anesthesia complications

## 2012-02-28 NOTE — Discharge Instructions (Signed)
[facility] °Office Phone Number [phone] ° °BREAST BIOPSY/ PARTIAL MASTECTOMY: POST OP INSTRUCTIONS ° °Always review your discharge instruction sheet given to you by the facility where your surgery was performed. ° °IF YOU HAVE DISABILITY OR FAMILY LEAVE FORMS, YOU MUST BRING THEM TO THE OFFICE FOR PROCESSING.  DO NOT GIVE THEM TO YOUR DOCTOR. ° °1. A prescription for pain medication may be given to you upon discharge.  Take your pain medication as prescribed, if needed.  If narcotic pain medicine is not needed, then you may take acetaminophen (Tylenol), naprosyn (Alleve) or ibuprofen (Advil) as needed. °2. Take your usually prescribed medications unless otherwise directed °3. If you need a refill on your pain medication, please contact your pharmacy.  They will contact our office to request authorization.  Prescriptions will not be filled after 5pm or on week-ends. °4. You should eat very light the first 24 hours after surgery, such as soup, crackers, pudding, etc.  Resume your normal diet the day after surgery. °5. Most patients will experience some swelling and bruising in the breast.  Ice packs and a good support bra will help.  Wear the breast binder provided or a sports bra for 72 hours day and night.  After that wear a sports bra during the day until you return to the office. Swelling and bruising can take several days to resolve.  °6. It is common to experience some constipation if taking pain medication after surgery.  Increasing fluid intake and taking a stool softener will usually help or prevent this problem from occurring.  A mild laxative (Milk of Magnesia or Miralax) should be taken according to package directions if there are no bowel movements after 48 hours. °7. Unless discharge instructions indicate otherwise, you may remove your bandages 48 hours after surgery and you may shower at that time.  You may have steri-strips (small skin tapes) in place directly over the incision.   These strips should be left on the skin for 7-10 days and will come off on their own.  If your surgeon used skin glue on the incision, you may shower in 24 hours.  The glue will flake off over the next 2-3 weeks.  Any sutures or staples will be removed at the office during your follow-up visit. °8. ACTIVITIES:  You may resume regular daily activities (gradually increasing) beginning the next day.  Wearing a good support bra or sports bra minimizes pain and swelling.  You may have sexual intercourse when it is comfortable. °a. You may drive when you no longer are taking prescription pain medication, you can comfortably wear a seatbelt, and you can safely maneuver your car and apply brakes. °b. RETURN TO WORK:  ______________________________________________________________________________________ °9. You should see your doctor in the office for a follow-up appointment approximately two weeks after your surgery.  Your doctor’s nurse will typically make your follow-up appointment when she calls you with your pathology report.  Expect your pathology report 3-4 business days after your surgery.  You may call to check if you do not hear from us after three days. °10. OTHER INSTRUCTIONS: _______________________________________________________________________________________________ _____________________________________________________________________________________________________________________________________ °_____________________________________________________________________________________________________________________________________ °_____________________________________________________________________________________________________________________________________ ° °WHEN TO CALL DR WAKEFIELD: °1. Fever over 101.0 °2. Nausea and/or vomiting. °3. Extreme swelling or bruising. °4. Continued bleeding from incision. °5. Increased pain, redness, or drainage from the incision. ° °The clinic staff is available to  answer your questions during regular business hours.  Please don’t hesitate to call and ask to speak to one of the nurses for   clinical concerns.  If you have a medical emergency, go to the nearest emergency room or call 911.  A surgeon from [facility] is always on call at the hospital. ° °For further questions, please visit [email or web address] ° °Post Anesthesia Home Care Instructions ° °Activity: °Get plenty of rest for the remainder of the day. A responsible adult should stay with you for 24 hours following the procedure.  °For the next 24 hours, DO NOT: °-Drive a car °-Operate machinery °-Drink alcoholic beverages °-Take any medication unless instructed by your physician °-Make any legal decisions or sign important papers. ° °Meals: °Start with liquid foods such as gelatin or soup. Progress to regular foods as tolerated. Avoid greasy, spicy, heavy foods. If nausea and/or vomiting occur, drink only clear liquids until the nausea and/or vomiting subsides. Call your physician if vomiting continues. ° °Special Instructions/Symptoms: °Your throat may feel dry or sore from the anesthesia or the breathing tube placed in your throat during surgery. If this causes discomfort, gargle with warm salt water. The discomfort should disappear within 24 hours. ° °

## 2012-02-28 NOTE — Anesthesia Procedure Notes (Signed)
Procedure Name: LMA Insertion Performed by: Bobbye Petti W Pre-anesthesia Checklist: Patient identified, Timeout performed, Emergency Drugs available, Suction available and Patient being monitored Patient Re-evaluated:Patient Re-evaluated prior to inductionOxygen Delivery Method: Circle system utilized Preoxygenation: Pre-oxygenation with 100% oxygen Intubation Type: IV induction Ventilation: Mask ventilation without difficulty LMA: LMA inserted LMA Size: 4.0 Number of attempts: 1 Placement Confirmation: breath sounds checked- equal and bilateral and positive ETCO2 Tube secured with: Tape Dental Injury: Teeth and Oropharynx as per pre-operative assessment      

## 2012-02-28 NOTE — Transfer of Care (Signed)
Immediate Anesthesia Transfer of Care Note  Patient: Candace Wells  Procedure(s) Performed: Procedure(s) (LRB): BREAST BIOPSY WITH NEEDLE LOCALIZATION (Right)  Patient Location: PACU  Anesthesia Type: General  Level of Consciousness: awake  Airway & Oxygen Therapy: Patient Spontanous Breathing and Patient connected to face mask oxygen  Post-op Assessment: Report given to PACU RN and Post -op Vital signs reviewed and stable  Post vital signs: Reviewed and stable  Complications: No apparent anesthesia complications

## 2012-02-28 NOTE — H&P (View-Only) (Signed)
Patient ID: Candace Wells, female   DOB: 08/24/1951, 61 y.o.   MRN: [id number]  Chief Complaint  Patient presents with  . Follow-up    exc bx of right breast    HPI Candace Wells is a 61 y.o. female.   HPI This is a 61 year old female who I know well from a left mastectomy and sentinel lymph node biopsy for a stage I left breast cancer. She was also treated with chemotherapy. She didn't tolerate her anti-estrogen therapy but her estrogen receptor was positive only at 5%. 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 She is on Elavil for this now and the only real solution is that she does not wear a bra on this side. She got her routine screening mammogram on the right side recently and it showed a small area of posterior calcifications that are not amenable to stereotactic biopsy. She comes in today without any complaints referable to her right breast to discuss a biopsy.  Past Medical History  Diagnosis Date  . Osteoporosis   . Cancer 2010     lt BREAST    Past Surgical History  Procedure Date  . Breast surgery 2010    left mastectomy and snbx    Family History  Problem Relation Age of Onset  . Cancer Mother     breast  . Cancer Father     colon  . Hypertension Father   . Hypertension Maternal Grandmother   . Heart disease Maternal Grandfather     Social History History  Substance Use Topics  . Smoking status: Never Smoker   . Smokeless tobacco: Not on file  . Alcohol Use: No    No Known Allergies  Current Outpatient Prescriptions  Medication Sig Dispense Refill  . amitriptyline (ELAVIL) 10 MG tablet Take 10 mg by mouth at bedtime.      . Calcium Carbonate-Vitamin D (CALCIUM + D PO) Take by mouth daily.      . Omega-3 Fatty Acids (FISH OIL PO) Take by mouth daily.        Review of Systems Review of Systems  Blood pressure 122/70, pulse 80, temperature 98 F (36.7 C),  temperature source Temporal, resp. rate 16, height 5\' 4"  (1.626 m), weight 137 lb 9.6 oz (62.415 kg).  Physical Exam Physical Exam  Vitals reviewed. Constitutional: She appears well-developed and well-nourished.  Neck: Neck supple.  Cardiovascular: Normal rate, regular rhythm and normal heart sounds.   Pulmonary/Chest: Effort normal and breath sounds normal. Right breast exhibits no inverted nipple, no mass, no nipple discharge, no skin change and no tenderness.    Lymphadenopathy:    She has no cervical adenopathy.    She has no axillary adenopathy.       Right: No supraclavicular adenopathy present.       Left: No supraclavicular adenopathy present.    Data Reviewed DIGITAL DIAGNOSTIC RIGHT MAMMOGRAM WITHOUT CAD  Comparison: Multiple priors  Findings: The patient has a scar in the upper-outer quadrant of  the right breast, posteriorly in the area of previously described  calcifications. The calcifications are localized in the spot  tangential view is done. However, calcifications are not in the  skin.  An attempt was made to do a stereotactic biopsy. However, the  calcifications are too far posterior and cannot be targeted.  Excisional biopsy is recommended. A surgical consultation is made  with Dr. Jamey Ripa Friday, May  17 that 1040 am at the patients  request.  IMPRESSION:  Calcifications, right breast which cannot be targeted  stereotactially. Excisional biopsy is recommended.  BI-RADS CATEGORY 4: Suspicious abnormality - biopsy should be  considered.   Assessment    Right breast mammographic abnormality    Plan    She very much would like to have a mastectomy on the right side. She has a history of breast cancer on the left side as well as a significant family history also. I think that that would be a reasonable option at this point as well. She desires a prophylactic mastectomy. I discussed with her that I do not think that that would be the best plan initially. I am  concerned about her postmastectomy pain syndrome on the left. If we're to proceed with a mastectomy right now if this is cancer we would have to go back and assess her lymph nodes and this would likely need be an axillary dissection. Either that or we would just leave it and follow her which I'm concerned about in a young healthy woman. The other option would be to do a sentinel lymph node at the time of the mastectomy but there is a very good chance this will be completely unnecessary. I think she is likely at higher risk than normal for PMPS but there is not a lot of data to support this one way or another. We discussed this at length today. I think the best option given the fact that she cannot have a core biopsy by radiology according to them would be to proceed with a right breast wire localization biopsy. Once we get the results back i would be an additional surgery but we could then proceed with a prophylactic or therapeutic mastectomy. We discussed the risks and benefits of a wire localization biopsy.       Tyla Burgner 02/08/2012, 9:24 AM

## 2012-02-28 NOTE — Interval H&P Note (Signed)
History and Physical Interval Note:  02/28/2012 9:34 AM  Candace Wells  has presented today for surgery, with the diagnosis of right breast mammographic calcifications  The various methods of treatment have been discussed with the patient and family. After consideration of risks, benefits and other options for treatment, the patient has consented to  Procedure(s) (LRB): BREAST BIOPSY WITH NEEDLE LOCALIZATION (Right) as a surgical intervention .  The patients' history has been reviewed, patient examined, no change in status, stable for surgery.  I have reviewed the patients' chart and labs.  Questions were answered to the patient's satisfaction.     Candace Wells

## 2012-02-28 NOTE — Op Note (Signed)
Preoperative diagnosis: History of left breast cancer with right breast calcifications not amenable to stereotactic biopsy Postoperative diagnosis: Same as above Procedure: Right breast wire localization excisional biopsy Surgeon: Dr. Harden Mo Anesthesia: Gen. With LMA Specimens: Right breast marked short stitch superior, long stitch lateral, double stitch deep Estimated blood loss: Minimal Drains: None Complications: None Sponge and count correct x2 at end of operation Disposition to recovery in stable condition  Indications: This is a 61 year old female I know well from a left mastectomy and sentinel lymph node biopsy for a clinical stage I left breast cancer. On follow-up mammogram she has some abnormal calcifications in the very posterior right breast. These are not amenable to radiologic biopsy. She was then referred for excisional biopsy using wire guidance. We discussed a right breast wire localization biopsy the risks and benefits associated with that.  Procedure: After informed consent was obtained the patient was first taken to the breast center where she had a wire placed by Dr. Manson Passey. I had these mammograms available for my review in the operating room. She was then administered 1 g of intravenous cefazolin. Sequential compression devices were placed on her legs. She was placed under general anesthesia with an LMA. Her right breast was prepped and draped in the standard sterile surgical fashion. A surgical timeout was then performed.  I infiltrated quarter percent Marcaine throughout the upper outer quadrant of her right breast. I then made a curvilinear incision overlying the wire. I then used cautery to excise the wire and the surrounding tissue all the way down to the pectoralis muscle including the pectoralis fascia. A Faxitron mammogram wasn taken confirming removal of the wire as well as the calcifications. This was confirmed by Dr. Manson Passey. Hemostasis was obtained. I then  closes with 2-0 Vicryl, 3-0 Vicryl, and 4-0 Monocryl. Steri-Strips and a sterile dressing were placed. She tolerated this well was extubated and transferred to recovery in stable condition.

## 2012-03-02 ENCOUNTER — Encounter (INDEPENDENT_AMBULATORY_CARE_PROVIDER_SITE_OTHER): Payer: BC Managed Care – PPO | Admitting: Surgery

## 2012-03-02 ENCOUNTER — Encounter (HOSPITAL_BASED_OUTPATIENT_CLINIC_OR_DEPARTMENT_OTHER): Payer: Self-pay | Admitting: General Surgery

## 2012-03-05 ENCOUNTER — Encounter (INDEPENDENT_AMBULATORY_CARE_PROVIDER_SITE_OTHER): Payer: Self-pay | Admitting: General Surgery

## 2012-03-05 ENCOUNTER — Ambulatory Visit (INDEPENDENT_AMBULATORY_CARE_PROVIDER_SITE_OTHER): Payer: BC Managed Care – PPO | Admitting: General Surgery

## 2012-03-05 VITALS — BP 110/70 | HR 72 | Resp 16 | Ht 64.0 in | Wt 137.0 lb

## 2012-03-05 DIAGNOSIS — Z09 Encounter for follow-up examination after completed treatment for conditions other than malignant neoplasm: Secondary | ICD-10-CM

## 2012-03-05 NOTE — Progress Notes (Signed)
Subjective:     Patient ID: Candace Wells, female   DOB: 09-Aug-1951, 61 y.o.   MRN: [id number]  HPI This is a 61 year old female underwent from a left mastectomy and sentinel node biopsy. She has postmastectomy pain syndrome on that side. She recently underwent a mammogram that showed a 3 mm area of calcifications in the posterior right breast. This was not amenable to stereotactic biopsy. I recently did a right breast wire localized biopsy of this area. 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 The closest margin is 2 mm. She's doing well from her operation and returned today to discuss these results. I've are to discuss these by phone with her. She even prior to this wanted a mastectomy on this side due to her issues on the other side and the fact that she does not want to wear broth to this. She comes in today to discuss her options.  Review of Systems     Objective:   Physical Exam     Assessment:     Right breast DCIS    Plan:     She previously underwent genetic testing and was negative. We discussed the newly diagnosed right breast cancer. We discussed the staging and pathophysiology of breast cancer. I discussed all of her options including breast conservation therapy combined with radiation therapy versus a mastectomy. She comes back in today and she very clearly only wanted to proceed with a mastectomy. We did again discuss other options but she very much wants a mastectomy. I discussed a simple mastectomy with her. We discussed the risks associated with this operation. I discussed possibility of having nodal disease that is not identified. We discussed the role of a sentinel lymph node biopsy as well as possibly doing an MRI to make sure there was no other abnormality in that breast did not do a sentinel lymph node biopsy. I discussed omitting a sentinel node biopsy. She very much would like to proceed with a sentinel lymph node  biopsy even though she understands that increases her risks for lymphedema even though this is small. It also increases her risk of having some chronic pain on this side which I think she is already high-risk for due to the fact that she has it on the other side. We will plan to do a mastectomy with a sentinel lymph node biopsy. Also discussed plastic surgery she and I discussed for the other side previously. She could have an immediate reconstruction on this side I think she is a good candidate for that. She would like to go Dr. Odis Luster again and then she is going to call me back after seeing them and making this decision. The concern is her reconstruction the left side worsened her pain.

## 2012-03-05 NOTE — Progress Notes (Deleted)
Patient ID: Candace Wells, female   DOB: 10-05-1951, 61 y.o.   MRN: [id number]  Chief Complaint  Patient presents with  . Follow-up    Discuss breast surgery    HPI Candace Wells is a 61 y.o. female.  *** HPI  Past Medical History  Diagnosis Date  . Osteoporosis   . No pertinent past medical history   . Cancer 2010     lt BREAST    Past Surgical History  Procedure Date  . Tonsillectomy   . Dilation and curettage of uterus   . Breast surgery 2010    left mastectomy and snbx  . Breast biopsy 02/28/2012    Procedure: BREAST BIOPSY WITH NEEDLE LOCALIZATION;  Surgeon: Emelia Loron, MD;  Location: [facility];  Service: General;  Laterality: Right;  Right breast wire guided biopsy    Family History  Problem Relation Age of Onset  . Cancer Mother     breast  . Cancer Father     colon  . Hypertension Father   . Hypertension Maternal Grandmother   . Heart disease Maternal Grandfather     Social History History  Substance Use Topics  . Smoking status: Never Smoker   . Smokeless tobacco: Not on file  . Alcohol Use: No    Allergies  Allergen Reactions  . Tetracyclines & Related Hives    Current Outpatient Prescriptions  Medication Sig Dispense Refill  . amitriptyline (ELAVIL) 10 MG tablet Take 10 mg by mouth at bedtime.      . Calcium Carbonate-Vitamin D (CALCIUM + D PO) Take by mouth daily.      . Omega-3 Fatty Acids (FISH OIL PO) Take by mouth daily.      Marland Kitchen oxyCODONE-acetaminophen (ROXICET) 5-325 MG per tablet Take 1 tablet by mouth every 4 (four) hours as needed for pain.  12 tablet  0    Review of Systems Review of Systems  Blood pressure 110/70, pulse 72, resp. rate 16, height 5\' 4"  (1.626 m), weight 137 lb (62.143 kg).  Physical Exam Physical Exam  Data Reviewed ***  Assessment    ***    Plan    ***       Derry Kassel 03/05/2012, 1:15 PM

## 2012-03-09 ENCOUNTER — Telehealth (INDEPENDENT_AMBULATORY_CARE_PROVIDER_SITE_OTHER): Payer: Self-pay

## 2012-03-09 NOTE — Telephone Encounter (Signed)
Spoke to Candace Wells about giving her the results of the hormone receptors being positive. The Candace Wells just thought was strange when her receptors were all negative last time. I told the Candace Wells that I would remind Dr Dwain Sarna of the negative receptors last time to see how to explain the difference in them now being positive.

## 2012-03-15 ENCOUNTER — Other Ambulatory Visit (INDEPENDENT_AMBULATORY_CARE_PROVIDER_SITE_OTHER): Payer: Self-pay | Admitting: General Surgery

## 2012-03-15 DIAGNOSIS — C50919 Malignant neoplasm of unspecified site of unspecified female breast: Secondary | ICD-10-CM

## 2012-03-16 ENCOUNTER — Other Ambulatory Visit (INDEPENDENT_AMBULATORY_CARE_PROVIDER_SITE_OTHER): Payer: Self-pay | Admitting: General Surgery

## 2012-03-16 DIAGNOSIS — C50919 Malignant neoplasm of unspecified site of unspecified female breast: Secondary | ICD-10-CM

## 2012-03-19 ENCOUNTER — Ambulatory Visit (INDEPENDENT_AMBULATORY_CARE_PROVIDER_SITE_OTHER): Payer: BC Managed Care – PPO | Admitting: General Surgery

## 2012-03-19 ENCOUNTER — Encounter (INDEPENDENT_AMBULATORY_CARE_PROVIDER_SITE_OTHER): Payer: Self-pay | Admitting: General Surgery

## 2012-03-19 VITALS — BP 98/60 | HR 98 | Temp 97.8°F | Resp 14 | Ht 64.0 in | Wt 137.8 lb

## 2012-03-19 DIAGNOSIS — Z09 Encounter for follow-up examination after completed treatment for conditions other than malignant neoplasm: Secondary | ICD-10-CM

## 2012-03-19 NOTE — Progress Notes (Signed)
Subjective:     Patient ID: Candace Wells, female   DOB: Oct 19, 1950, 61 y.o.   MRN: [id number]  HPI This is a 61 year old female who underwent from a left mastectomy who had postmastectomy pain syndrome on that side. She recently had a wire localized biopsy of a right-sided lesion that was not amenable to stereotactic biopsy that showed high-grade ductal carcinoma in situ that was completely excised. She's doing well from that. I talked to her about her options and she very much once undergo a right simple mastectomy on that side. We discussed the role of a lymph node biopsy and we've decided to proceed with that as well. She returns today after seeing plastic surgery she is going to attempt to do a bilateral tissue expander reconstruction at the same time.  Review of Systems     Objective:   Physical Exam Deferred today    Assessment:     Right breast dcis History tnbc, left    Plan:     We discussed again right mastectomy with snbx that is now scheduled for early July in combination with Dr. Odis Luster for bilateral tissue expander reconstruction. I will also refer her back to see Dr. Darnelle Catalan in the interim as she would also like to speak with him about this decision.

## 2012-03-26 ENCOUNTER — Telehealth: Payer: Self-pay | Admitting: Oncology

## 2012-03-26 ENCOUNTER — Ambulatory Visit (HOSPITAL_BASED_OUTPATIENT_CLINIC_OR_DEPARTMENT_OTHER): Payer: BC Managed Care – PPO | Admitting: Oncology

## 2012-03-26 ENCOUNTER — Other Ambulatory Visit: Payer: Self-pay | Admitting: Oncology

## 2012-03-26 VITALS — BP 111/76 | HR 90 | Temp 98.6°F | Ht 64.0 in | Wt 141.2 lb

## 2012-03-26 DIAGNOSIS — Z170 Estrogen receptor positive status [ER+]: Secondary | ICD-10-CM

## 2012-03-26 DIAGNOSIS — C50919 Malignant neoplasm of unspecified site of unspecified female breast: Secondary | ICD-10-CM

## 2012-03-26 NOTE — Telephone Encounter (Signed)
S/w the pt and she is aware of her oct 2013 appts 

## 2012-03-26 NOTE — Progress Notes (Signed)
ID: [name]   DOB: 06-24-1951  MR#: [id number]  WUJ#:[id number]  HISTORY OF PRESENT ILLNESS: Lynden Ang has "busy breasts" and has had multiple biopsies in both breasts, primarily the left breast, where biopsies in February of 2001 and August of 2001 showed ADH.  Most recently, she palpated a mass in her left breast which showed it to be quite dense.  The patient reported a palpable mass in the 12 o'clock position, approximately 1-2 cm superior to the nipple, and this was confirmed on exam by Dr. Jean Rosenthal.  Diagnostic mammography on March 31st showed again very stable breasts with no areas of distortion or microcalcifications.  Mammograms were negative.  However, the ultrasound showed hypoechoic solid mass in the area in question measuring up to 1 cm.  There was no shadowing associated with this.  Biopsy was obtained on April 5th under ultrasound guidance, and this showed ([id number]) an invasive breast cancer which was ER "positive" at 5%, PR negative at 0% with a proliferation marker of 49%, and HER-2 negative by CISH with a ratio of 1.26.    With this information, the patient was referred to Dr. Dwain Sarna and given the fact that she had had multiple breast biopsies previously, the patient opted for left mastectomy with sentinel lymph node sampling.  Breast MRI showed only the mass in question and it measured 3.2 cm by MRI. The definitive surgery was performed on April 21st and showed ([id number]) an invasive ductal carcinoma, grade 3; measuring between 1.5 and 2.0 cm, with 0 of 3 sentinel lymph nodes involved.  There was no lymphovascular invasion and margins were ample.  Her subsequent history is as detailed below  INTERVAL HISTORY: Since the last visit here Olegario Messier had right diagnostic mammography 01/25/2012 showing a 3 mm cluster of heterogeneous calcifications in the right breast. This proved to be to posterior for stereotactic biopsy, so excisional biopsy was performed 02/08/2012. The  pathology ([id number]) showed a high-grade ductal carcinoma in situ, strongly estrogen and progesterone receptor positive. The closest margin was at 2 mm. The tumor was estrogen and progesterone receptor positive, both at 100%  REVIEW OF SYSTEMS: She tolerated the procedure well. She has met with Dr. Derryl Harbor and Dr. Dwain Sarna and is planning to have a right mastectomy with bilateral implant reconstruction. This has been tentatively scheduled for July 8. Aside from all these issues, she continues to have a urinary tract infections, and we discussed issues regarding vaginal dryness extensively today. She of course continues to have a pain syndrome related to her left mastectomy, under good control through a [facility] pain clinic. A detailed review of systems was otherwise noncontributory  PAST MEDICAL HISTORY: Past Medical History  Diagnosis Date  . Osteoporosis   . Cancer 2010     lt BREAST    PAST SURGICAL HISTORY: Past Surgical History  Procedure Date  . Tonsillectomy   . Dilation and curettage of uterus   . Breast surgery 2010    left mastectomy and snbx  . Breast biopsy 02/28/2012    Procedure: BREAST BIOPSY WITH NEEDLE LOCALIZATION;  Surgeon: Emelia Loron, MD;  Location: [facility];  Service: General;  Laterality: Right;  Right breast wire guided biopsy    FAMILY HISTORY Family History  Problem Relation Age of Onset  . Cancer Mother     breast  . Cancer Father     colon  . Hypertension Father   . Hypertension Maternal Grandmother   . Heart disease Maternal Grandfather  The patient's father is alive at age 61  Her mother was diagnosed with breast cancer at age 61 and died of a bone marrow transplant for breast cancer at age 61  The patient has one sister and one brother, both alive and in good health.  There is considerable breast cancer history on the patient's mother's side. The patient's mother's sister had breast cancer.  The patient's maternal  grandmother and three of her sisters and her brother all had breast cancer according to the patient.  Nevertheless, the patient has been tested for BRCA-1 and she tells me she has been found to be negative.  GYNECOLOGIC HISTORY: The patient is GX P3, first pregnancy to term at age 61.  She went through the change of life about 2004.  She did not take hormone replacement.    SOCIAL HISTORY: She works as a Interior and spatial designer.  Her husband of >40 years, [name] - is working currently as a Research scientist (medical) and is semiretired.  The patient's daughter [name] has four children and is married to [name], one of our ER physicians.  They live in [place].  Daughter [name] is getting her PhD at [employer].  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  She is divorced and has a son.  Daughter [name] lives in [place] and works there as a Interior and spatial designer.  The patient is a [employer].     ADVANCED DIRECTIVES:  HEALTH MAINTENANCE: History  Substance Use Topics  . Smoking status: Never Smoker   . Smokeless tobacco: Never Used  . Alcohol Use: No     Colonoscopy:  PAP:  Bone density:  Lipid panel:  Allergies  Allergen Reactions  . Tetracyclines & Related Hives    Current Outpatient Prescriptions  Medication Sig Dispense Refill  . amitriptyline (ELAVIL) 10 MG tablet Take 10 mg by mouth at bedtime.      . Calcium Carbonate-Vitamin D (CALCIUM + D PO) Take by mouth daily.      . Omega-3 Fatty Acids (FISH OIL PO) Take by mouth daily.        OBJECTIVE: Middle-aged white woman who appears well Filed Vitals:   03/26/12 1440  BP: 111/76  Pulse: 90  Temp: 98.6 F (37 C)     Body mass index is 24.24 kg/(m^2).    ECOG FS: 1  Sclerae unicteric Oropharynx clear No cervical or supraclavicular adenopathy Lungs no rales or rhonchi Heart regular rate and rhythm Abd benign MSK no focal spinal tenderness, no peripheral edema Neuro: nonfocal Breasts: The right breast is status post  recent biopsy. A there is no significant swelling, redness, inflammation, dehiscence, or tenderness. There are no skin or nipple changes of concern. The right axilla is unremarkable. The left breast is status post mastectomy. There is no evidence of local recurrence.  LAB RESULTS: Lab Results  Component Value Date   WBC 5.8 02/27/2012   NEUTROABS 4.6 07/12/2011   HGB 13.0 02/27/2012   HCT 38.5 02/27/2012   MCV 92.5 02/27/2012   PLT 221 02/27/2012      Chemistry      Component Value Date/Time   NA 142 02/27/2012 1200   K 3.9 02/27/2012 1200   CL 104 02/27/2012 1200   CO2 29 02/27/2012 1200   BUN 18 02/27/2012 1200   CREATININE 0.66 02/27/2012 1200      Component Value Date/Time   CALCIUM 9.9 02/27/2012 1200   ALKPHOS 99 07/12/2011 1412   ALKPHOS 99 07/12/2011 1412  AST 29 07/12/2011 1412   AST 29 07/12/2011 1412   ALT 31 07/12/2011 1412   ALT 31 07/12/2011 1412   BILITOT 0.5 07/12/2011 1412   BILITOT 0.5 07/12/2011 1412       Lab Results  Component Value Date   LABCA2 19 07/26/2010    No components found with this basename: [id number]    No results found for this basename: INR:1;PROTIME:1 in the last 168 hours  Urinalysis No results found for this basename: colorurine, appearanceur, labspec, phurine, glucoseu, hgbur, bilirubinur, ketonesur, proteinur, urobilinogen, nitrite, leukocytesur    STUDIES: Mm Breast Surgical Specimen  02/28/2012  *RADIOLOGY REPORT*  Clinical Data:  Calcifications in the upper outer quadrant of the right breast.  The patient presents for needle-localized excision. History of left mastectomy.  NEEDLE LOCALIZATION WITH MAMMOGRAPHIC GUIDANCE AND SPECIMEN RADIOGRAPH  Patient presents for needle localization prior to excisional biopsy.  I met with the patient and we discussed the procedure of needle localization including benefits and alternatives. We discussed the high likelihood of a successful procedure. We discussed the risks of the procedure, including  infection, bleeding, tissue injury, and further surgery. Informed, written consent was given.  Using mammographic guidance, sterile technique, 2% lidocaine and a 7 cm modified Kopans needle, calcifications are localized using a lateral approach.  The films are marked for Dr. Dwain Sarna.  [facility], and confirms calcifications and wire to be present in the tissue sample.  The specimen is marked for pathology.  IMPRESSION: Needle localization of the right breast.  No apparent complications.  Original Report Authenticated By: Patterson Hammersmith, M.D.    ASSESSMENT: 61 y.o. BRCA 1-2 negative [place] woman   (1) status post left mastectomy and sentinel lymph node dissection in April 2010 for a T1c N0, stage IA invasive ductal carcinoma, grade 3, ER "positive" at 5%, PR and HER2/neu negative, with an MIB-1 of 49%; treated adjuvantly with 4 cycles of dose dense doxorubicin/ cyclophosphamide completed June 2010.  On tamoxifen very briefly, discontinued due to intolerance.  Decided to forego antiestrogen therapy as well due to her history of osteopenia.   (2) status post right excisional biopsy May 2013 for a high-grade ductal carcinoma in situ, strongly estrogen and progesterone receptor positive  PLAN: I think her decision to proceed to right mastectomy is eminently reasonable. We discussed the fact that these tumors, being noninvasive, are not immediately life-threatening, and furthermore since they cannot  "travel" outside of the breast, mastectomy is curative 99% of the time. She is planning to have bilateral implant reconstruction, which I also think is a good move. All this is scheduled to happen July 8.  I have made her a return appointment with me in October. At that point we can consider tamoxifen again, not because of this noninvasive tumor, but because of the earlier one, even though it was only minimally estrogen receptor positive, partly because I  would feel more comfortable with her using vaginal estrogens under cover of tamoxifen. She has a good understanding of all this, and we will make a definitive decision when she returns to see me in October. She knows to call for any other problems that may develop before that visit.   Cruzita Lipa C    03/26/2012

## 2012-04-17 NOTE — Progress Notes (Signed)
Bring overnight bag-all meds-to come in for labs

## 2012-04-18 ENCOUNTER — Encounter (HOSPITAL_BASED_OUTPATIENT_CLINIC_OR_DEPARTMENT_OTHER)
Admission: RE | Admit: 2012-04-18 | Discharge: 2012-04-18 | Disposition: A | Discharge status: Home / Self Care | Payer: BC Managed Care – PPO | Source: Ambulatory Visit | Attending: General Surgery | Admitting: General Surgery

## 2012-04-18 LAB — BASIC METABOLIC PANEL
BUN: 18 mg/dL (ref 6–23)
Calcium: 9.6 mg/dL (ref 8.4–10.5)
Creatinine, Ser: 0.72 mg/dL (ref 0.50–1.10)
GFR calc Af Amer: >90 mL/min (ref >90)
GFR calc non Af Amer: >90 mL/min (ref >90)

## 2012-04-18 LAB — CBC
MCHC: 32.8 g/dL (ref 30.0–36.0)
RDW: 13.2 % (ref 11.5–15.5)

## 2012-04-23 ENCOUNTER — Encounter (HOSPITAL_BASED_OUTPATIENT_CLINIC_OR_DEPARTMENT_OTHER): Payer: Self-pay | Admitting: *Deleted

## 2012-04-23 ENCOUNTER — Ambulatory Visit (HOSPITAL_BASED_OUTPATIENT_CLINIC_OR_DEPARTMENT_OTHER)
Admission: RE | Admit: 2012-04-23 | Discharge: 2012-04-24 | Disposition: A | Discharge status: Home / Self Care | Payer: BC Managed Care – PPO | Source: Ambulatory Visit | Attending: General Surgery | Admitting: General Surgery

## 2012-04-23 ENCOUNTER — Ambulatory Visit (HOSPITAL_BASED_OUTPATIENT_CLINIC_OR_DEPARTMENT_OTHER): Payer: BC Managed Care – PPO | Admitting: *Deleted

## 2012-04-23 ENCOUNTER — Ambulatory Visit (HOSPITAL_COMMUNITY)
Admission: RE | Admit: 2012-04-23 | Discharge: 2012-04-23 | Disposition: A | Discharge status: Home / Self Care | Payer: BC Managed Care – PPO | Source: Ambulatory Visit | Attending: General Surgery | Admitting: General Surgery

## 2012-04-23 ENCOUNTER — Encounter (HOSPITAL_BASED_OUTPATIENT_CLINIC_OR_DEPARTMENT_OTHER): Payer: Self-pay | Admitting: Anesthesiology

## 2012-04-23 ENCOUNTER — Encounter (HOSPITAL_BASED_OUTPATIENT_CLINIC_OR_DEPARTMENT_OTHER)
Admission: RE | Disposition: A | Discharge status: Home / Self Care | Payer: Self-pay | Source: Ambulatory Visit | Attending: General Surgery

## 2012-04-23 DIAGNOSIS — D0590 Unspecified type of carcinoma in situ of unspecified breast: Secondary | ICD-10-CM | POA: Insufficient documentation

## 2012-04-23 DIAGNOSIS — C50919 Malignant neoplasm of unspecified site of unspecified female breast: Secondary | ICD-10-CM

## 2012-04-23 DIAGNOSIS — Z853 Personal history of malignant neoplasm of breast: Secondary | ICD-10-CM | POA: Insufficient documentation

## 2012-04-23 HISTORY — PX: BREAST RECONSTRUCTION: SHX9

## 2012-04-23 SURGERY — SIMPLE MASTECTOMY WITH AXILLARY SENTINEL NODE BIOPSY
Anesthesia: General | Site: Breast | Laterality: Right | Wound class: Clean

## 2012-04-23 MED ORDER — HYDROMORPHONE HCL PF 1 MG/ML IJ SOLN
1.0000 mg | INTRAMUSCULAR | Status: DC | PRN
  Administered 2012-04-23 – 2012-04-24 (×3): 1 mg via INTRAVENOUS

## 2012-04-23 MED ORDER — LIDOCAINE HCL (CARDIAC) 20 MG/ML IV SOLN
INTRAVENOUS | Status: DC | PRN
  Administered 2012-04-23: 100 mg via INTRAVENOUS

## 2012-04-23 MED ORDER — ACETAMINOPHEN 10 MG/ML IV SOLN
1000.0000 mg | Freq: Once | INTRAVENOUS | Status: AC
  Administered 2012-04-23: 1000 mg via INTRAVENOUS

## 2012-04-23 MED ORDER — METOCLOPRAMIDE HCL 5 MG/ML IJ SOLN
10.0000 mg | Freq: Once | INTRAMUSCULAR | Status: AC | PRN
  Administered 2012-04-23: 10 mg via INTRAVENOUS

## 2012-04-23 MED ORDER — OXYCODONE HCL 5 MG PO TABS: 5.0000 mg | ORAL_TABLET | Freq: Once | ORAL | Status: DC | PRN

## 2012-04-23 MED ORDER — KETOROLAC TROMETHAMINE 30 MG/ML IJ SOLN
30.0000 mg | Freq: Once | INTRAMUSCULAR | Status: AC
  Administered 2012-04-23: 30 mg via INTRAVENOUS

## 2012-04-23 MED ORDER — FENTANYL CITRATE 0.05 MG/ML IJ SOLN
INTRAMUSCULAR | Status: DC | PRN
  Administered 2012-04-23 (×4): 50 ug via INTRAVENOUS

## 2012-04-23 MED ORDER — SCOPOLAMINE 1 MG/3DAYS TD PT72
1.0000 | MEDICATED_PATCH | TRANSDERMAL | Status: DC
  Administered 2012-04-23: 1.5 mg via TRANSDERMAL

## 2012-04-23 MED ORDER — MORPHINE SULFATE 2 MG/ML IJ SOLN
2.0000 mg | INTRAMUSCULAR | Status: DC | PRN
  Administered 2012-04-23: 2 mg via INTRAVENOUS

## 2012-04-23 MED ORDER — TECHNETIUM TC 99M SULFUR COLLOID FILTERED
1.0000 | Freq: Once | INTRAVENOUS | Status: AC | PRN
  Administered 2012-04-23: 1 via INTRADERMAL

## 2012-04-23 MED ORDER — CEFAZOLIN SODIUM 1-5 GM-% IV SOLN
1.0000 g | Freq: Three times a day (TID) | INTRAVENOUS | Status: DC
  Administered 2012-04-23: 1 g via INTRAVENOUS

## 2012-04-23 MED ORDER — AMITRIPTYLINE HCL 10 MG PO TABS
10.0000 mg | ORAL_TABLET | Freq: Every day | ORAL | Status: DC
  Administered 2012-04-23: 10 mg via ORAL

## 2012-04-23 MED ORDER — SODIUM CHLORIDE 0.9 % IR SOLN
Status: DC | PRN
  Administered 2012-04-23 (×2)

## 2012-04-23 MED ORDER — CEFAZOLIN SODIUM 1-5 GM-% IV SOLN: 1.0000 g | Freq: Three times a day (TID) | INTRAVENOUS | Status: DC

## 2012-04-23 MED ORDER — PHENYLEPHRINE HCL 10 MG/ML IJ SOLN
10.0000 mg | INTRAVENOUS | Status: DC | PRN
  Administered 2012-04-23: 40 ug/min via INTRAVENOUS
  Administered 2012-04-23: 09:00:00 via INTRAVENOUS

## 2012-04-23 MED ORDER — LACTATED RINGERS IV SOLN
INTRAVENOUS | Status: DC
  Administered 2012-04-23 (×5): via INTRAVENOUS

## 2012-04-23 MED ORDER — METOCLOPRAMIDE HCL 5 MG/ML IJ SOLN
INTRAMUSCULAR | Status: DC | PRN
  Administered 2012-04-23 (×2): 10 mg via INTRAVENOUS

## 2012-04-23 MED ORDER — FENTANYL CITRATE 0.05 MG/ML IJ SOLN
25.0000 ug | INTRAMUSCULAR | Status: DC | PRN
  Administered 2012-04-23: 50 ug via INTRAVENOUS
  Administered 2012-04-23 (×3): 25 ug via INTRAVENOUS

## 2012-04-23 MED ORDER — FENTANYL CITRATE 0.05 MG/ML IJ SOLN
50.0000 ug | INTRAMUSCULAR | Status: DC | PRN
  Administered 2012-04-23: 100 ug via INTRAVENOUS

## 2012-04-23 MED ORDER — NEOSTIGMINE METHYLSULFATE 1 MG/ML IJ SOLN
INTRAMUSCULAR | Status: DC | PRN
  Administered 2012-04-23: 3 mg via INTRAVENOUS

## 2012-04-23 MED ORDER — GLYCOPYRROLATE 0.2 MG/ML IJ SOLN
INTRAMUSCULAR | Status: DC | PRN
  Administered 2012-04-23: .4 mg via INTRAVENOUS

## 2012-04-23 MED ORDER — CEFAZOLIN SODIUM 1-5 GM-% IV SOLN
1.0000 g | INTRAVENOUS | Status: AC
  Administered 2012-04-23 (×2): 1 g via INTRAVENOUS

## 2012-04-23 MED ORDER — DEXAMETHASONE SODIUM PHOSPHATE 4 MG/ML IJ SOLN
INTRAMUSCULAR | Status: DC | PRN
  Administered 2012-04-23: 10 mg via INTRAVENOUS

## 2012-04-23 MED ORDER — EPHEDRINE SULFATE 50 MG/ML IJ SOLN
INTRAMUSCULAR | Status: DC | PRN
  Administered 2012-04-23 (×2): 10 mg via INTRAVENOUS

## 2012-04-23 MED ORDER — SUCCINYLCHOLINE CHLORIDE 20 MG/ML IJ SOLN
INTRAMUSCULAR | Status: DC | PRN
  Administered 2012-04-23: 100 mg via INTRAVENOUS

## 2012-04-23 MED ORDER — PROPOFOL 10 MG/ML IV EMUL
INTRAVENOUS | Status: DC | PRN
  Administered 2012-04-23: 200 mg via INTRAVENOUS

## 2012-04-23 MED ORDER — OXYCODONE HCL 5 MG PO TABS: 5.0000 mg | ORAL_TABLET | ORAL | Status: DC | PRN

## 2012-04-23 MED ORDER — ACETAMINOPHEN 325 MG PO TABS: 650.0000 mg | ORAL_TABLET | Freq: Four times a day (QID) | ORAL | Status: DC | PRN

## 2012-04-23 MED ORDER — SODIUM CHLORIDE 0.9 % IV SOLN
INTRAVENOUS | Status: DC
  Administered 2012-04-23: 15:00:00 via INTRAVENOUS

## 2012-04-23 MED ORDER — PHENYLEPHRINE HCL 10 MG/ML IJ SOLN
INTRAMUSCULAR | Status: DC | PRN
  Administered 2012-04-23 (×3): 40 ug via INTRAVENOUS

## 2012-04-23 MED ORDER — ONDANSETRON HCL 4 MG/2ML IJ SOLN
4.0000 mg | Freq: Four times a day (QID) | INTRAMUSCULAR | Status: DC | PRN
  Administered 2012-04-23: 4 mg via INTRAVENOUS

## 2012-04-23 MED ORDER — KETOROLAC TROMETHAMINE 15 MG/ML IJ SOLN
15.0000 mg | Freq: Four times a day (QID) | INTRAMUSCULAR | Status: DC | PRN
  Administered 2012-04-24: 15 mg via INTRAVENOUS

## 2012-04-23 MED ORDER — ONDANSETRON HCL 4 MG/2ML IJ SOLN
INTRAMUSCULAR | Status: DC | PRN
  Administered 2012-04-23: 4 mg via INTRAVENOUS

## 2012-04-23 MED ORDER — PROMETHAZINE HCL 25 MG/ML IJ SOLN
12.5000 mg | Freq: Four times a day (QID) | INTRAMUSCULAR | Status: DC | PRN
  Administered 2012-04-23 (×2): 12.5 mg via INTRAVENOUS

## 2012-04-23 MED ORDER — ROCURONIUM BROMIDE 100 MG/10ML IV SOLN
INTRAVENOUS | Status: DC | PRN
  Administered 2012-04-23: 10 mg via INTRAVENOUS
  Administered 2012-04-23: 30 mg via INTRAVENOUS

## 2012-04-23 MED ORDER — ACETAMINOPHEN 650 MG RE SUPP: 650.0000 mg | Freq: Four times a day (QID) | RECTAL | Status: DC | PRN

## 2012-04-23 MED ORDER — MIDAZOLAM HCL 2 MG/2ML IJ SOLN
0.5000 mg | INTRAMUSCULAR | Status: DC | PRN
  Administered 2012-04-23: 1 mg via INTRAVENOUS

## 2012-04-23 SURGICAL SUPPLY — 121 items
ADH SKN CLS APL DERMABOND .7 (GAUZE/BANDAGES/DRESSINGS) ×4
APL SKNCLS STERI-STRIP NONHPOA (GAUZE/BANDAGES/DRESSINGS) ×2
APPLIER CLIP 11 MED OPEN (CLIP)
APPLIER CLIP 9.375 MED OPEN (MISCELLANEOUS)
APR CLP MED 11 20 MLT OPN (CLIP)
APR CLP MED 9.3 20 MLT OPN (MISCELLANEOUS)
BAG DECANTER FOR FLEXI CONT (MISCELLANEOUS) ×1 IMPLANT
BANDAGE ELASTIC 6 VELCRO ST LF (GAUZE/BANDAGES/DRESSINGS) ×2 IMPLANT
BENZOIN TINCTURE PRP APPL 2/3 (GAUZE/BANDAGES/DRESSINGS) ×3 IMPLANT
BINDER BREAST LRG (GAUZE/BANDAGES/DRESSINGS) ×1 IMPLANT
BINDER BREAST MEDIUM (GAUZE/BANDAGES/DRESSINGS) IMPLANT
BINDER BREAST XLRG (GAUZE/BANDAGES/DRESSINGS) IMPLANT
BINDER BREAST XXLRG (GAUZE/BANDAGES/DRESSINGS) IMPLANT
BIOPATCH RED 1 DISK 7.0 (GAUZE/BANDAGES/DRESSINGS) ×4 IMPLANT
BLADE HEX COATED 2.75 (ELECTRODE) ×3 IMPLANT
BLADE SURG 10 STRL SS (BLADE) ×3 IMPLANT
BLADE SURG 15 STRL LF DISP TIS (BLADE) ×4 IMPLANT
BLADE SURG 15 STRL SS (BLADE) ×9
BLADE SURG ROTATE 9660 (MISCELLANEOUS) IMPLANT
CANISTER SUCTION 1200CC (MISCELLANEOUS) ×5 IMPLANT
CATH ROBINSON RED A/P 14FR (CATHETERS) ×1 IMPLANT
CHLORAPREP W/TINT 26ML (MISCELLANEOUS) ×5 IMPLANT
CLEANER CAUTERY TIP 5X5 PAD (MISCELLANEOUS) IMPLANT
CLIP APPLIE 11 MED OPEN (CLIP) IMPLANT
CLIP APPLIE 9.375 MED OPEN (MISCELLANEOUS) IMPLANT
CLOTH BEACON ORANGE TIMEOUT ST (SAFETY) ×5 IMPLANT
COVER MAYO STAND STRL (DRAPES) ×6 IMPLANT
COVER PROBE W GEL 5X96 (DRAPES) ×3 IMPLANT
COVER SURGICAL LIGHT HANDLE (MISCELLANEOUS) ×1 IMPLANT
COVER TABLE BACK 60X90 (DRAPES) ×5 IMPLANT
DECANTER SPIKE VIAL GLASS SM (MISCELLANEOUS) ×2 IMPLANT
DERMABOND ADVANCED (GAUZE/BANDAGES/DRESSINGS) ×2
DERMABOND ADVANCED .7 DNX12 (GAUZE/BANDAGES/DRESSINGS) IMPLANT
DRAIN CHANNEL 10M FLAT 3/4 FLT (DRAIN) IMPLANT
DRAIN CHANNEL 19F RND (DRAIN) ×5 IMPLANT
DRAPE LAPAROSCOPIC ABDOMINAL (DRAPES) ×5 IMPLANT
DRAPE SURG 17X23 STRL (DRAPES) ×8 IMPLANT
DRAPE UTILITY XL STRL (DRAPES) ×3 IMPLANT
DRSG PAD ABDOMINAL 8X10 ST (GAUZE/BANDAGES/DRESSINGS) ×8 IMPLANT
DRSG TEGADERM 4X4.75 (GAUZE/BANDAGES/DRESSINGS) ×6 IMPLANT
ELECT BLADE 4.0 EZ CLEAN MEGAD (MISCELLANEOUS) ×3
ELECT BLADE 6.5 .24CM SHAFT (ELECTRODE) IMPLANT
ELECT REM PT RETURN 9FT ADLT (ELECTROSURGICAL) ×3
ELECTRODE BLDE 4.0 EZ CLN MEGD (MISCELLANEOUS) IMPLANT
ELECTRODE REM PT RTRN 9FT ADLT (ELECTROSURGICAL) ×4 IMPLANT
EVACUATOR SILICONE 100CC (DRAIN) ×5 IMPLANT
FILTER 7/8 IN (FILTER) ×3 IMPLANT
GAUZE SPONGE 4X4 12PLY STRL LF (GAUZE/BANDAGES/DRESSINGS) IMPLANT
GAUZE XEROFORM 1X8 LF (GAUZE/BANDAGES/DRESSINGS) IMPLANT
GAUZE XEROFORM 5X9 LF (GAUZE/BANDAGES/DRESSINGS) IMPLANT
GLOVE BIO SURGEON STRL SZ7 (GLOVE) ×5 IMPLANT
GLOVE BIO SURGEON STRL SZ7.5 (GLOVE) ×3 IMPLANT
GLOVE BIOGEL M 7.0 STRL (GLOVE) ×1 IMPLANT
GLOVE BIOGEL PI IND STRL 7.0 (GLOVE) IMPLANT
GLOVE BIOGEL PI IND STRL 7.5 (GLOVE) ×2 IMPLANT
GLOVE BIOGEL PI IND STRL 8 (GLOVE) ×2 IMPLANT
GLOVE BIOGEL PI INDICATOR 7.0 (GLOVE) ×3
GLOVE BIOGEL PI INDICATOR 7.5 (GLOVE) ×2
GLOVE BIOGEL PI INDICATOR 8 (GLOVE) ×1
GOWN PREVENTION PLUS XLARGE (GOWN DISPOSABLE) ×6 IMPLANT
GOWN PREVENTION PLUS XXLARGE (GOWN DISPOSABLE) ×3 IMPLANT
GRAFT FLEX HD 4X16 THICK (Tissue Mesh) ×1 IMPLANT
IV NS 500ML (IV SOLUTION)
IV NS 500ML BAXH (IV SOLUTION) IMPLANT
KIT FILL MCGHAN 30CC (MISCELLANEOUS) IMPLANT
NDL 21 GA WING INFUSION (NEEDLE) IMPLANT
NDL HYPO 25X1 1.5 SAFETY (NEEDLE) ×4 IMPLANT
NDL SAFETY ECLIPSE 18X1.5 (NEEDLE) ×2 IMPLANT
NEEDLE 21 GA WING INFUSION (NEEDLE) ×3 IMPLANT
NEEDLE 27GAX1X1/2 (NEEDLE) IMPLANT
NEEDLE HYPO 18GX1.5 SHARP (NEEDLE) ×3
NEEDLE HYPO 25X1 1.5 SAFETY (NEEDLE) ×9 IMPLANT
NS IRRIG 1000ML POUR BTL (IV SOLUTION) ×5 IMPLANT
PACK BASIN DAY SURGERY FS (CUSTOM PROCEDURE TRAY) ×6 IMPLANT
PAD CLEANER CAUTERY TIP 5X5 (MISCELLANEOUS) ×1
PAD EYE OVAL STERILE LF (GAUZE/BANDAGES/DRESSINGS) IMPLANT
PENCIL BUTTON HOLSTER BLD 10FT (ELECTRODE) ×3 IMPLANT
PIN SAFETY STERILE (MISCELLANEOUS) ×3 IMPLANT
SET ASEPTIC TRANSFER (MISCELLANEOUS) ×1 IMPLANT
SHEET MEDIUM DRAPE 40X70 STRL (DRAPES) ×3 IMPLANT
SLEEVE SCD COMPRESS KNEE MED (MISCELLANEOUS) ×3 IMPLANT
SPONGE GAUZE 4X4 12PLY (GAUZE/BANDAGES/DRESSINGS) ×3 IMPLANT
SPONGE LAP 18X18 X RAY DECT (DISPOSABLE) ×11 IMPLANT
SPONGE LAP 4X18 X RAY DECT (DISPOSABLE) IMPLANT
STAPLER VISISTAT 35W (STAPLE) ×3 IMPLANT
STOCKINETTE IMPERVIOUS LG (DRAPES) IMPLANT
STRIP CLOSURE SKIN 1/2X4 (GAUZE/BANDAGES/DRESSINGS) ×3 IMPLANT
SUCTION FRAZIER TIP 10 FR DISP (SUCTIONS) IMPLANT
SUT CHROMIC 4 0 P 3 18 (SUTURE) IMPLANT
SUT ETHILON 2 0 FS 18 (SUTURE) ×3 IMPLANT
SUT MNCRL AB 3-0 PS2 18 (SUTURE) ×3 IMPLANT
SUT MNCRL AB 4-0 PS2 18 (SUTURE) ×3 IMPLANT
SUT MON AB 2-0 CT1 36 (SUTURE) IMPLANT
SUT PDS 3-0 CT2 (SUTURE) ×9
SUT PDS AB 0 CT 36 (SUTURE) IMPLANT
SUT PDS AB 2-0 CT2 27 (SUTURE) IMPLANT
SUT PDS II 3-0 CT2 27 ABS (SUTURE) IMPLANT
SUT PROLENE 3 0 PS 2 (SUTURE) ×5 IMPLANT
SUT PROLENE 4 0 PS 2 18 (SUTURE) IMPLANT
SUT SILK 2 0 SH (SUTURE) IMPLANT
SUT SILK 3 0 SH 30 (SUTURE) IMPLANT
SUT VIC AB 2-0 SH 27 (SUTURE)
SUT VIC AB 2-0 SH 27XBRD (SUTURE) IMPLANT
SUT VIC AB 3-0 54X BRD REEL (SUTURE) IMPLANT
SUT VIC AB 3-0 BRD 54 (SUTURE)
SUT VIC AB 3-0 FS2 27 (SUTURE) IMPLANT
SUT VIC AB 3-0 SH 27 (SUTURE)
SUT VIC AB 3-0 SH 27X BRD (SUTURE) IMPLANT
SUT VICRYL 3-0 CR8 SH (SUTURE) ×6 IMPLANT
SUT VICRYL 4-0 PS2 18IN ABS (SUTURE) IMPLANT
SYR 50ML LL SCALE MARK (SYRINGE) IMPLANT
SYR BULB IRRIGATION 50ML (SYRINGE) ×6 IMPLANT
SYR CONTROL 10ML LL (SYRINGE) ×6 IMPLANT
TISSUE EXPANDER (Prosthesis & Implant Plastic) ×2 IMPLANT
TOWEL OR 17X24 6PK STRL BLUE (TOWEL DISPOSABLE) ×10 IMPLANT
TOWEL OR NON WOVEN STRL DISP B (DISPOSABLE) ×5 IMPLANT
TUBE CONNECTING 20X1/4 (TUBING) ×5 IMPLANT
UNDERPAD 30X30 INCONTINENT (UNDERPADS AND DIAPERS) ×6 IMPLANT
VAC PENCILS W/TUBING CLEAR (MISCELLANEOUS) ×3 IMPLANT
WATER STERILE IRR 1000ML POUR (IV SOLUTION) ×4 IMPLANT
YANKAUER SUCT BULB TIP NO VENT (SUCTIONS) ×7 IMPLANT

## 2012-04-23 NOTE — Anesthesia Postprocedure Evaluation (Signed)
Anesthesia Post Note  Patient: Candace Wells  Procedure(s) Performed: Procedure(s) (LRB): SIMPLE MASTECTOMY WITH AXILLARY SENTINEL NODE BIOPSY (Right) BREAST RECONSTRUCTION (Bilateral)  Anesthesia type: General  Patient location: PACU  Post pain: Pain level controlled  Post assessment: Patient's Cardiovascular Status Stable  Last Vitals:  Filed Vitals:   04/23/12 1453  BP:   Pulse:   Temp: 36.4 C  Resp:     Post vital signs: Reviewed and stable  Level of consciousness: alert  Complications: No apparent anesthesia complications

## 2012-04-23 NOTE — H&P (Signed)
  This is a 61 year old female underwent from a left mastectomy and sentinel node biopsy. She has postmastectomy pain syndrome on that side. She recently underwent a mammogram that showed a 3 mm area of calcifications in the posterior right breast. This was not amenable to stereotactic biopsy. I recently did a right breast wire localized biopsy of this area. 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 The closest margin is 2 mm. She's doing well from her operation and returned today to discuss these results. I've are to discuss these by phone with her. She even prior to this wanted a mastectomy on this side due to her issues on the other side and the fact that she does not want to wear broth to this. She comes in today to discuss her options.  Review of Systems   Objective:   Physical Exam   Assessment:    Right breast DCIS   Plan:    She previously underwent genetic testing and was negative. We discussed the newly diagnosed right breast cancer. We discussed the staging and pathophysiology of breast cancer. I discussed all of her options including breast conservation therapy combined with radiation therapy versus a mastectomy. She comes back in today and she very clearly only wanted to proceed with a mastectomy. We did again discuss other options but she very much wants a mastectomy. I discussed a simple mastectomy with her. We discussed the risks associated with this operation. I discussed possibility of having nodal disease that is not identified. We discussed the role of a sentinel lymph node biopsy as well as possibly doing an MRI to make sure there was no other abnormality in that breast did not do a sentinel lymph node biopsy. I discussed omitting a sentinel node biopsy. She very much would like to proceed with a sentinel lymph node biopsy even though she understands that increases her risks for lymphedema even though this is small. It also increases  her risk of having some chronic pain on this side which I think she is already high-risk for due to the fact that she has it on the other side. We will plan to do a mastectomy with a sentinel lymph node biopsy. We are going to plan on bilateral reconstruction at same time also.

## 2012-04-23 NOTE — Op Note (Signed)
Preoperative diagnosis: History of left breast cancer status post mastectomy Right breast ductal carcinoma in situ Postoperative diagnosis: Same as above Procedure: #1 right simple mastectomy #2 right axillary sentinel lymph node biopsy Surgeon: Dr. Harden Mo Estimated blood loss: Minimal Specimens: #1 right breast tissue marked with short stitch superior, stitch lateral #2 right axillary sentinel lymph node x1 with a count of 1399 Complications: None Disposition: Case turned over to Dr. Odis Luster for reconstruction Sponge and needle count was correct at the end of my portion of procedure  Indications: This is a  61 year old female I took care of for left breast cancer undergoing a mastectomy as well as sentinel lymph node biopsy. On her followup she was noted to have calcifications on the right breast on mammography. This was not amenable to stereotactic biopsy and she underwent a wire localization biopsy showing ductal carcinoma in situ. These margins were clear. We discussed all of her options and she would like to proceed with a mastectomy. We discussed the risks and benefits of the sentinel lymph node biopsy at the same time and we both decided to proceed with this as well. She has also been seen by Dr. Odis Luster and she will undergo bilateral expander based reconstruction at the same time.  Procedure: After informed consent was obtained the patient was first injected with technetium in the standard periareolar fashion. She was administered 1 g of intravenous cefazolin. Sequential compression devices were placed on her legs prior to beginning. She was placed under general anesthesia without complication. Her right breast, right axilla, and left chest were then prepped and draped in the standard sterile surgical fashion. Surgical time was then performed.  I marked out an elliptical incision her right breast. I needed to make this larger as the incision from the recent biopsy was well lateral.  Her superficial margin was 2 mm so I needed to remove this as well. I then made the incision and created flaps to the sternum, clavicle, inframammary crease, and latissimus laterally. This was somewhat difficult especially in the region of her biopsy. I then removed her breast tissue and the pectoralis fascia from the muscle. In the region of her biopsy as well as the inferior medial aspect of the pectoralis muscle this was very difficult. Eventually I was able to remove her entire breast. I then used the neoprobe to identify one sentinel lymph node that was in the axillary tail. The count is as listed as above. There were no further lymph nodes present. The background count was less than 20. I then obtained hemostasis. Irrigation was performed. I then turned the case over to Dr. Odis Luster for reconstruction.

## 2012-04-23 NOTE — Anesthesia Preprocedure Evaluation (Signed)
Anesthesia Evaluation  Patient identified by MRN, date of birth, ID band Patient awake    Reviewed: Allergy & Precautions, H&P , NPO status , Patient's Chart, lab work & pertinent test results, reviewed documented beta blocker date and time   Airway Mallampati: II TM Distance: >3 FB Neck ROM: full    Dental   Pulmonary neg pulmonary ROS,          Cardiovascular negative cardio ROS      Neuro/Psych negative neurological ROS  negative psych ROS   GI/Hepatic negative GI ROS, Neg liver ROS,   Endo/Other  negative endocrine ROS  Renal/GU negative Renal ROS  negative genitourinary   Musculoskeletal   Abdominal   Peds  Hematology negative hematology ROS (+)   Anesthesia Other Findings See surgeon's H&P   Reproductive/Obstetrics negative OB ROS                           Anesthesia Physical Anesthesia Plan  ASA: I  Anesthesia Plan: General   Post-op Pain Management:    Induction: Intravenous  Airway Management Planned: Oral ETT  Additional Equipment:   Intra-op Plan:   Post-operative Plan: Extubation in OR  Informed Consent: I have reviewed the patients History and Physical, chart, labs and discussed the procedure including the risks, benefits and alternatives for the proposed anesthesia with the patient or authorized representative who has indicated his/her understanding and acceptance.   Dental Advisory Given  Plan Discussed with: CRNA and Surgeon  Anesthesia Plan Comments:         Anesthesia Quick Evaluation

## 2012-04-23 NOTE — Transfer of Care (Signed)
Immediate Anesthesia Transfer of Care Note  Patient: Candace Wells  Procedure(s) Performed: Procedure(s) (LRB): SIMPLE MASTECTOMY WITH AXILLARY SENTINEL NODE BIOPSY (Right) BREAST RECONSTRUCTION (Bilateral)  Patient Location: PACU  Anesthesia Type: General  Level of Consciousness: awake and oriented  Airway & Oxygen Therapy: Patient Spontanous Breathing and Patient connected to face mask oxygen  Post-op Assessment: Post -op Vital signs reviewed and stable  Post vital signs: Reviewed and stable  Complications: No apparent anesthesia complications

## 2012-04-23 NOTE — Interval H&P Note (Signed)
History and Physical Interval Note:  04/23/2012 7:37 AM  Candace Wells  has presented today for surgery, with the diagnosis of Right breast cancer  The various methods of treatment have been discussed with the patient and family. After consideration of risks, benefits and other options for treatment, the patient has consented to  Procedure(s) (LRB): SIMPLE MASTECTOMY WITH AXILLARY SENTINEL NODE BIOPSY (Right) as a surgical intervention .  The patient's history has been reviewed, patient examined, no change in status, stable for surgery.  I have reviewed the patients' chart and labs.  Questions were answered to the patient's satisfaction.     Elizibeth Breau

## 2012-04-23 NOTE — Brief Op Note (Signed)
04/23/2012  12:26 PM  PATIENT:  Candace Wells  61 y.o. female  PRE-OPERATIVE DIAGNOSIS:  Right breast cancer  POST-OPERATIVE DIAGNOSIS:  Right breast cancer  PROCEDURE:  Procedure(s) (LRB): SIMPLE MASTECTOMY WITH AXILLARY SENTINEL NODE BIOPSY (Right) BREAST RECONSTRUCTION (Bilateral) with tissue expanders and HDFlex on right side  SURGEON:  Surgeon(s) and Role: Panel 1:    * Emelia Loron, MD - Primary  Panel 2:    * Etter Sjogren, MD - Primary  PHYSICIAN ASSISTANT:   ASSISTANTS: none   ANESTHESIA:   general  EBL:  Total I/O In: 2000 [I.V.:2000] Out: 150 [Urine:150]  BLOOD ADMINISTERED:none  DRAINS: 4 Blakes (3 on right and 1 on left)   LOCAL MEDICATIONS USED:  NONE  SPECIMEN:  No Specimen  DISPOSITION OF SPECIMEN:  N/A  COUNTS:  YES  TOURNIQUET:  * No tourniquets in log *  DICTATION: .Other Dictation: Dictation Number [id number]  PLAN OF CARE: Admit for overnight observation  PATIENT DISPOSITION:  PACU - hemodynamically stable.   Delay start of Pharmacological VTE agent (>24hrs) due to surgical blood loss or risk of bleeding: yes

## 2012-04-23 NOTE — Anesthesia Procedure Notes (Addendum)
Performed by: Zenia Resides D   Procedure Name: Intubation Date/Time: 04/23/2012 8:10 AM Performed by: Zenia Resides D Pre-anesthesia Checklist: Patient identified, Emergency Drugs available, Suction available, Patient being monitored and Timeout performed Patient Re-evaluated:Patient Re-evaluated prior to inductionOxygen Delivery Method: Circle System Utilized Preoxygenation: Pre-oxygenation with 100% oxygen Intubation Type: IV induction Ventilation: Mask ventilation without difficulty Laryngoscope Size: Mac and 3 Grade View: Grade I Tube type: Oral Number of attempts: 1 Airway Equipment and Method: stylet and oral airway Placement Confirmation: ETT inserted through vocal cords under direct vision,  positive ETCO2 and breath sounds checked- equal and bilateral Secured at: 22 cm Tube secured with: Tape Dental Injury: Teeth and Oropharynx as per pre-operative assessment

## 2012-04-24 MED ORDER — METHOCARBAMOL 500 MG PO TABS: 500.0000 mg | ORAL_TABLET | Freq: Four times a day (QID) | ORAL | Status: AC

## 2012-04-24 MED ORDER — PROMETHAZINE HCL 25 MG PO TABS: 25.0000 mg | ORAL_TABLET | Freq: Four times a day (QID) | ORAL | Status: DC | PRN

## 2012-04-24 MED ORDER — METHOCARBAMOL 500 MG PO TABS: 500.0000 mg | ORAL_TABLET | Freq: Four times a day (QID) | ORAL | Status: DC

## 2012-04-24 MED ORDER — HYDROMORPHONE HCL 2 MG PO TABS: 2.0000 mg | ORAL_TABLET | ORAL | Status: DC | PRN

## 2012-04-24 MED ORDER — HYDROMORPHONE HCL 2 MG PO TABS: 2.0000 mg | ORAL_TABLET | ORAL | Status: AC | PRN

## 2012-04-24 MED ORDER — CEPHALEXIN 500 MG PO CAPS: 500.0000 mg | ORAL_CAPSULE | Freq: Two times a day (BID) | ORAL | Status: AC

## 2012-04-24 MED ORDER — CEPHALEXIN 500 MG PO CAPS: 500.0000 mg | ORAL_CAPSULE | Freq: Two times a day (BID) | ORAL | Status: DC

## 2012-04-24 NOTE — Progress Notes (Signed)
Doing well, nausea controlled now, dc home today, will call with path

## 2012-04-24 NOTE — Op Note (Signed)
NAME:  Candace Wells, Candace Wells NO.:  [id number]  MEDICAL RECORD NO.:  [id number]  LOCATION:                                 FACILITY:  PHYSICIAN:  Etter Sjogren, M.D.     DATE OF BIRTH:  1950-12-06  DATE OF PROCEDURE:  04/23/2012 DATE OF DISCHARGE:                              OPERATIVE REPORT   PREOPERATIVE DIAGNOSIS:  Breast cancer.  POSTOPERATIVE DIAGNOSIS:  Breast cancer.  PROCEDURE PERFORMED: 1. Breast reconstruction, right side with tissue expander. 2. Separate distinct procedure, placement of acellular dermal matrix     (HD flex) 64 cm2. 3. Left breast reconstruction with tissue expander.  SURGEON:  Etter Sjogren, M.D.  ANESTHESIA:  General.  ESTIMATED BLOOD LOSS:  50 mL.  DRAINS:  Three 19-French drains on the right side, one 19-French drain on the left side.  CLINICAL INDICATION:  This is a 61 year old woman who has had breast cancer on the left.  She has had mastectomy and now presents with a breast cancer on the right for sentinel lymph node mastectomy and reconstruction and she wants to go ahead and proceed with bilateral reconstruction.  The nature of these procedures and risks plus complications were discussed with her in detail.  Risks include, but not limited to, bleeding, infection, anesthesia complications, healing problems, scarring, loss of sensation, fluid accumulations, failure of device, capsular contracture, displacement of the device, wrinkles, ripples, pneumothorax, pulmonary embolism, asymmetry, disappointment, the possibility of the need to use acellular dermal matrix, especially on the right side, which is the immediate reconstruction side and loss of skin, loss of tissue, and failure of the reconstruction with loss of tissue expanders.  She understood all this and wished to proceed.  DESCRIPTION OF PROCEDURE:  The patient was in the operating room and the mastectomy had been completed.  The wound was irrigated thoroughly  with saline and then some antibiotic.  Excellent hemostasis was achieved using electrocautery.  The resection had required the removal of a quite a significant portion of the muscle inferiorly and therefore there was no choice but to use acellular dermal matrix if the reconstruction was going to be performed.  The HD flex was soaked in antibiotic solution as well as saline for greater than 10 minutes.  The pectoralis muscle was lifted and a space made underneath it and then after thoroughly cleaning gloves, the expander was prepared with 550 mL mentor moderate height expander.  It was placed in antibiotic solution.  It was soaked for greater than 10 minutes.  The air was removed, 50 mL sterile saline placed using closed filling system and the expander was positioned and the suture tabs used to secure 3-0 Vicryl sutures to the chest wall and then the HD flex was positioned dermal side up towards the mastectomy flap and secured with 3-0 PDS simple running suture.  This achieved total coverage of combination of muscle and acellular dermal matrix. Again irrigation with saline antibiotic solution.  Drains were positioned 19-French, one was placed deep at the tissue expander and was positioned just prior to closing, the last running row of the 3-0 PDS and then 1 drain under the superior mastectomy flap and one under the inferior mastectomy  flap and these were secured with 3-0 Prolene sutures, brought through separate stab wounds inferolaterally, and then excellent hemostasis was confirmed.  The mastectomy flaps looked very healthy.  No evidence of any vascular compromise and the closure with 3- 0 and 4-0 Monocryl interrupted inverted deep dermal sutures.  Biopatch dressings placed around the drains.  Attention directed to the left side where the lateral aspect of the old mastectomy scar used, the dissection carried down to the subcutaneous tissue.  The underlying muscle was identified and a  muscle-splitting incision was used to carry the dissection deep to the muscle and a submuscular space was then developed using electrocautery.  Great care was taken to avoid any damage to the underlying chest cavity and meticulous hemostasis with electrocautery.  Thorough irrigation with saline as well as antibiotic solution.  A 19-French drain was positioned, brought through a separate stab wound inferolaterally and secured with a 3-0 Prolene suture and then after thoroughly cleaning gloves, the expander was prepared.  Again this was a 550 mL mentor moderate height implant.  The air was removed, 50 mL sterile saline placed using a closed filling system, and the expander was then positioned in the submuscular space.  Care taken to make sure there are no wrinkles in it and the muscle closure with 3-0 Vicryl interrupted figure-of-eight sutures.  Great care taken to avoid damage to the underlying tissue expander and then thorough irrigation with antibiotic solution.  Again antibiotic solution was placed in the space also prior to placing the tissue expander.  The skin closure with 3-0 Monocryl interrupted inverted deep dermal sutures and a running 3-0 Monocryl subcuticular suture.  Dermabond was applied.  Biopatch and Tegaderm are around the drains and ABDs over the incisions and she was placed in the chest binder and was transported to the recovery room stable and tolerated the procedure well.  DISPOSITION:  She will be observed overnight.     Etter Sjogren, M.D.     DB/MEDQ  D:  04/23/2012  T:  04/23/2012  Job:  [id number]  cc:   Etter Sjogren, M.D.

## 2012-04-24 NOTE — Discharge Summary (Signed)
Physician Discharge Summary  Patient ID: Candace Wells MRN: [id number] DOB/AGE: 1951-03-25 61 y.o.  Admit date: 04/23/2012 Discharge date: 04/24/2012  Admission Diagnoses:Breast cancer  Discharge Diagnoses: same Active Problems:  * No active hospital problems. *    Discharged Condition: good  Hospital Course: On the day of admission the patient was taken to surgery and had right mastectomy and bilateral breast reconstruction with tissue expanders. HD Flex was placed on right.. The patient tolerated the procedures well. Postoperatively, the flap maintained excellent color and capillary refill. The patient was ambulatory and tolerating diet on the first postoperative day. She is ready for discharge..  Treatments: antibiotics: Ancef, anticoagulation: none and surgery: right mastectomy and sentinel node with bilateral breast reconstruction using tissue expanders  Discharge Exam: Blood pressure 118/69, pulse 106, temperature 97.8 F (36.6 C), temperature source Oral, resp. rate 16, height 5\' 4"  (1.626 m), weight 138 lb (62.596 kg), SpO2 99.00%.  Operative sites: Mastectomy flaps viable. Good color throughout.Tissue expanders are in good position. Drains functioning. Drainage thin.  Disposition: 01-Home or Self Care  Discharge Orders    Future Appointments: Provider: Department: Dept Phone: Center:   07/31/2012 1:00 PM Windell Hummingbird [facility] [phone] None   07/31/2012 1:30 PM Lowella Dell, MD [facility] [phone] None     Medication List  As of 04/24/2012  7:21 AM   ASK your doctor about these medications         amitriptyline 10 MG tablet   Commonly known as: ELAVIL   Take 10 mg by mouth at bedtime.      CALCIUM + D PO   Take by mouth daily.      FISH OIL PO   Take by mouth daily.           Follow-up Information    Follow up with [facility], MD in 2 weeks.   Contact information:   [facility] [address] [place] [address] [phone]          Signed: Rossie Muskrat 04/24/2012, 7:21 AM

## 2012-04-27 ENCOUNTER — Encounter (HOSPITAL_BASED_OUTPATIENT_CLINIC_OR_DEPARTMENT_OTHER): Payer: Self-pay | Admitting: General Surgery

## 2012-05-11 ENCOUNTER — Ambulatory Visit (INDEPENDENT_AMBULATORY_CARE_PROVIDER_SITE_OTHER): Payer: BC Managed Care – PPO | Admitting: General Surgery

## 2012-05-11 ENCOUNTER — Encounter (INDEPENDENT_AMBULATORY_CARE_PROVIDER_SITE_OTHER): Payer: Self-pay | Admitting: General Surgery

## 2012-05-11 VITALS — BP 142/90 | HR 100 | Temp 98.5°F | Resp 16 | Ht 64.0 in | Wt 137.4 lb

## 2012-05-11 DIAGNOSIS — Z09 Encounter for follow-up examination after completed treatment for conditions other than malignant neoplasm: Secondary | ICD-10-CM

## 2012-05-11 NOTE — Progress Notes (Signed)
Subjective:     Patient ID: Candace Wells, female   DOB: 10/12/1951, 62 y.o.   MRN: [id number]  HPI This is a 61 year old female I know well from a chair for a left breast cancer. She recently was diagnosed with a stage 0 right breast cancer. After multiple discussions we decided to proceed with a right simple mastectomy and bilateral reconstruction. Her final pathology shows no further cancer present. She is doing very well today with her expanders in place. She has been expanded once already. She reports no real complaints. Her prior preop pain on the left is certainly not any worse with this at all.  Review of Systems     Objective:   Physical Exam Incisions healing on both sides without infection and expanders in place. She has one drain on either side draining serous fluid.    Assessment:     Right breast stage 0 cancer status post mastectomy and sentinel lymph node biopsy    Plan:     I think she is doing really well I'm happy that her pain is not any worse after surgery. She is going to continue seeing Dr. Odis Luster to be expanded. Her restrictions will be based on Dr. Odis Luster. She will plan on seeing me in 6 months. She is due to see medical oncology in October.

## 2012-07-17 ENCOUNTER — Other Ambulatory Visit: Payer: BC Managed Care – PPO | Admitting: Lab

## 2012-07-24 ENCOUNTER — Ambulatory Visit: Payer: BC Managed Care – PPO | Admitting: Oncology

## 2012-07-31 ENCOUNTER — Ambulatory Visit (HOSPITAL_BASED_OUTPATIENT_CLINIC_OR_DEPARTMENT_OTHER): Payer: BC Managed Care – PPO | Admitting: Oncology

## 2012-07-31 ENCOUNTER — Telehealth: Payer: Self-pay | Admitting: Oncology

## 2012-07-31 ENCOUNTER — Encounter: Payer: Self-pay | Admitting: Oncology

## 2012-07-31 ENCOUNTER — Other Ambulatory Visit (HOSPITAL_BASED_OUTPATIENT_CLINIC_OR_DEPARTMENT_OTHER): Payer: BC Managed Care – PPO

## 2012-07-31 ENCOUNTER — Other Ambulatory Visit: Payer: Self-pay | Admitting: *Deleted

## 2012-07-31 VITALS — BP 120/86 | HR 87 | Temp 98.1°F | Resp 20 | Ht 64.0 in | Wt 140.1 lb

## 2012-07-31 DIAGNOSIS — M949 Disorder of cartilage, unspecified: Secondary | ICD-10-CM

## 2012-07-31 DIAGNOSIS — C50919 Malignant neoplasm of unspecified site of unspecified female breast: Secondary | ICD-10-CM

## 2012-07-31 DIAGNOSIS — M899 Disorder of bone, unspecified: Secondary | ICD-10-CM

## 2012-07-31 DIAGNOSIS — F419 Anxiety disorder, unspecified: Secondary | ICD-10-CM | POA: Insufficient documentation

## 2012-07-31 DIAGNOSIS — D0590 Unspecified type of carcinoma in situ of unspecified breast: Secondary | ICD-10-CM

## 2012-07-31 DIAGNOSIS — F411 Generalized anxiety disorder: Secondary | ICD-10-CM

## 2012-07-31 LAB — CBC WITH DIFFERENTIAL/PLATELET
BASO%: 0.4 % (ref 0.0–2.0)
EOS%: 1.7 % (ref 0.0–7.0)
HCT: 37.4 % (ref 34.8–46.6)
LYMPH%: 12.9 % — ABNORMAL LOW (ref 14.0–49.7)
MCH: 31 pg (ref 25.1–34.0)
MCHC: 33.4 g/dL (ref 31.5–36.0)
MCV: 92.8 fL (ref 79.5–101.0)
MONO#: 0.4 10*3/uL (ref 0.1–0.9)
MONO%: 6.1 % (ref 0.0–14.0)
NEUT%: 78.9 % — ABNORMAL HIGH (ref 38.4–76.8)
Platelets: 232 10*3/uL (ref 145–400)

## 2012-07-31 LAB — COMPREHENSIVE METABOLIC PANEL (CC13)
ALT: 23 U/L (ref 0–55)
CO2: 25 mEq/L (ref 22–29)
Creatinine: 0.7 mg/dL (ref 0.6–1.1)
Total Bilirubin: 0.4 mg/dL (ref 0.20–1.20)

## 2012-07-31 MED ORDER — TAMOXIFEN CITRATE 20 MG PO TABS: 20.0000 mg | ORAL_TABLET | Freq: Every day | ORAL | Status: DC

## 2012-07-31 MED ORDER — ALPRAZOLAM 0.25 MG PO TABS: 0.2500 mg | ORAL_TABLET | Freq: Two times a day (BID) | ORAL | Status: DC | PRN

## 2012-07-31 NOTE — Patient Instructions (Signed)
Xanax 0.25mg  up to twice daily as needed for anxiety.  Restart tamoxifen, 20mg  daily.  We will assess tolerance in 3 months (Jan 2014), but call prior to that time of any problems or questions.   ([phone])

## 2012-07-31 NOTE — Progress Notes (Signed)
ID: Candace Wells   DOB: 05/11/1951  MR#: [id number]  JYN#:[id number]  HISTORY OF PRESENT ILLNESS: Candace Wells has "busy breasts" and has had multiple biopsies in both breasts, primarily the left breast, where biopsies in February of 2001 and August of 2001 showed ADH.  Most recently, she palpated a mass in her left breast which showed it to be quite dense.  The patient reported a palpable mass in the 12 o'clock position, approximately 1-2 cm superior to the nipple, and this was confirmed on exam by Dr. Jean Rosenthal.  Diagnostic mammography on March 31st showed again very stable breasts with no areas of distortion or microcalcifications.  Mammograms were negative.  However, the ultrasound showed hypoechoic solid mass in the area in question measuring up to 1 cm.  There was no shadowing associated with this.  Biopsy was obtained on April 5th under ultrasound guidance, and this showed ([id number] and [id number]) an invasive breast cancer which was ER "positive" at 5%, PR negative at 0% with a proliferation marker of 49%, and HER-2 negative by CISH with a ratio of 1.26.    With this information, the patient was referred to Dr. Dwain Sarna and given the fact that she had had multiple breast biopsies previously, the patient opted for left mastectomy with sentinel lymph node sampling.  Breast MRI showed only the mass in question and it measured 3.2 cm by MRI. The definitive surgery was performed on April 21st and showed ([id number]) an invasive ductal carcinoma, grade 3; measuring between 1.5 and 2.0 cm, with 0 of 3 sentinel lymph nodes involved.  There was no lymphovascular invasion and margins were ample.  Her subsequent history is as detailed below  INTERVAL HISTORY: Candace Wells returns today for followup of her bilateral breast carcinomas. Interval history is remarkable for [facility] having undergone a right simple mastectomy in July of 2013 for a high-grade DCIS. She tolerated the procedure well, and is in the process of having  bilateral reconstruction. Her expanders are in place, and she anticipates her final implant reconstruction to be completed in [place].  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 Her family has been under a lot of stress. Her father is having back surgery today. Her 40-year-old grandson who lives out of state recently had a dresser fall on him and has been in the [facility] there. Fortunately, he appears to be making a full recovery. All of this, however, has increased Candace Wells's stress level. She tells me she has used Xanax on occasion in the past with good results. She occasionally has some difficulty sleeping. She denies actual depression, and also denies any suicidal ideations.  REVIEW OF SYSTEMS: Physically, Candace Wells is feeling well today. Her energy level is good. She's had no recent illnesses and denies any fevers, chills, or night sweats. She currently denies any hot flashes. She tends to have mild vaginal dryness, but no vaginal bleeding and no vaginal discharge. She has some occasional arthritic pain in her right hip which is not new. She denies any additional myalgias or arthralgias. The postsurgical pain/neuropathic pain in the left chest wall seems to have improved. She's had no peripheral swelling. She's eating and drinking well no nausea or change in bowel habits. No cough, shortness of breath, or chest pain. No abnormal headaches, dizziness, or change in vision.  A detailed review of systems is otherwise noncontributory.  PAST MEDICAL HISTORY: Past Medical History  Diagnosis Date  . Osteoporosis   . Cancer 2010  lt BREAST    PAST SURGICAL HISTORY: Past Surgical History  Procedure Date  . Tonsillectomy   . Dilation and curettage of uterus   . Breast surgery 2010    left mastectomy and snbx  . Breast biopsy 02/28/2012    Procedure: BREAST BIOPSY WITH NEEDLE LOCALIZATION;  Surgeon: Emelia Loron, MD;  Location: [facility];  Service:  General;  Laterality: Right;  Right breast wire guided biopsy  . Breast reconstruction 04/23/2012    Procedure: BREAST RECONSTRUCTION;  Surgeon: Etter Sjogren, MD;  Location: [facility];  Service: Plastics;  Laterality: Bilateral;  bilateral breast reconstruction with Tissue Expander placement    FAMILY HISTORY Family History  Problem Relation Age of Onset  . Cancer Mother     breast  . Cancer Father     colon  . Hypertension Father   . Hypertension Maternal Grandmother   . Heart disease Maternal Grandfather   The patient's father is alive at age 61.  Her mother was diagnosed with breast cancer at age 70 and died of a bone marrow transplant for breast cancer at age 89.  The patient has one sister and one brother, both alive and in good health.  There is considerable breast cancer history on the patient's mother's side. The patient's mother's sister had breast cancer.  The patient's maternal grandmother and three of her sisters and her brother all had breast cancer according to the patient.  Nevertheless, the patient has been tested for BRCA-1 and she tells me she has been found to be negative.  GYNECOLOGIC HISTORY: The patient is GX P3, first pregnancy to term at age 26.  She went through the change of life about 2004.  She did not take hormone replacement.    SOCIAL HISTORY: She works as a Interior and spatial designer.  Her husband of >40 years, Candace Wells "Candace Wells" - is working currently as a Research scientist (medical) and is semiretired.  The patient's daughter Candace Wells has four children and is married to Candace Wells, one of our ER physicians.  They live in [place].  Daughter Candace Wells is getting her PhD at [employer].  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  She is divorced and has a son.  Daughter Candace Wells lives in [place] and works there as a Interior and spatial designer.  The patient is a [employer].     ADVANCED DIRECTIVES:  HEALTH MAINTENANCE: History  Substance Use Topics  . Smoking status: Never  Smoker   . Smokeless tobacco: Never Used  . Alcohol Use: No     Colonoscopy:  PAP:  Bone density:  Lipid panel:  Allergies  Allergen Reactions  . Tetracyclines & Related Hives    Current Outpatient Prescriptions  Medication Sig Dispense Refill  . Calcium Carbonate-Vitamin D (CALCIUM + D PO) Take by mouth daily.      Marland Kitchen ALPRAZolam (XANAX) 0.25 MG tablet Take 1 tablet (0.25 mg total) by mouth 2 (two) times daily as needed for anxiety.  30 tablet  0  . tamoxifen (NOLVADEX) 20 MG tablet Take 1 tablet (20 mg total) by mouth daily.  30 tablet  6    OBJECTIVE: Middle-aged white woman who appears well Filed Vitals:   07/31/12 1341  BP: 120/86  Pulse: 87  Temp: 98.1 F (36.7 C)  Resp: 20     Body mass index is 24.04 kg/(m^2).    ECOG FS: 0 Filed Weights   07/31/12 1341  Weight: 140 lb 1.3 oz (63.54 kg)   Sclerae  unicteric Oropharynx clear No cervical or supraclavicular adenopathy Lungs no rales or rhonchi Heart regular rate and rhythm Abd soft, nontender, with positive bowel sounds MSK no focal spinal tenderness,  No peripheral edema Neuro: nonfocal, alert and oriented x3 Breasts: Patient status post bilateral mastectomies, expanders intact bilaterally pending implant reconstruction. No evidence of local recurrence in the chest wall. Axillae are benign bilaterally with no adenopathy.  LAB RESULTS: Lab Results  Component Value Date   WBC 6.1 07/31/2012   NEUTROABS 4.8 07/31/2012   HGB 12.5 07/31/2012   HCT 37.4 07/31/2012   MCV 92.8 07/31/2012   PLT 232 07/31/2012      Chemistry      Component Value Date/Time   NA 139 04/18/2012 1600   K 3.9 04/18/2012 1600   CL 101 04/18/2012 1600   CO2 28 04/18/2012 1600   BUN 18 04/18/2012 1600   CREATININE 0.72 04/18/2012 1600      Component Value Date/Time   CALCIUM 9.6 04/18/2012 1600   ALKPHOS 99 07/12/2011 1412   ALKPHOS 99 07/12/2011 1412   AST 29 07/12/2011 1412   AST 29 07/12/2011 1412   ALT 31 07/12/2011 1412   ALT 31 07/12/2011  1412   BILITOT 0.5 07/12/2011 1412   BILITOT 0.5 07/12/2011 1412       Lab Results  Component Value Date   LABCA2 19 07/26/2010    STUDIES: No results found.   ASSESSMENT: 61 y.o. BRCA 1-2 negative [place] woman   (1) status post left mastectomy and sentinel lymph node dissection in April 2010 for a T1c N0, stage IA invasive ductal carcinoma, grade 3, ER "positive" at 5%, PR and HER2/neu negative, with an MIB-1 of 49%; treated adjuvantly with 4 cycles of dose dense doxorubicin/ cyclophosphamide completed June 2010.  On tamoxifen very briefly, discontinued due to intolerance.  Decided to forego antiestrogen therapy as well due to her history of osteopenia.   (2) status post right simple mastectomy in July 2013 for a high-grade ductal carcinoma in situ, strongly estrogen and progesterone receptor positive, both at 100%, grade 3, with clear margins. 0 of one lymph node was involved.  (3)  currently undergoing implant reconstruction bilaterally under the care of Dr. Odis Luster, her final surgery scheduled for 08/21/2012  PLAN: Candace Wells has recovered well from her recent surgery, and is ready to complete her reconstruction under the care of Dr. Odis Luster in early Wells. She is having a little anxiety, and I have given her a short-term prescription for alprazolam, 0.25 mg to take a maximum of twice daily.   Otherwise, over half of our 40 minute appointment today was spent reviewing Candace Wells's prognosis, discussing therapy with tamoxifen, and coordinating her care. Previously, she had a difficult time tolerating the tamoxifen due to excessive vaginal discharge. She is willing to try the tamoxifen once again, and we will reassess her tolerance when she returns in 3 months. Of course should she experience side effects prior to that appointment, she knows to contact us.      Keyontay Stolz    07/31/2012

## 2012-07-31 NOTE — Telephone Encounter (Signed)
gve the pt her jan 2014 appt calendar °

## 2012-08-01 LAB — VITAMIN D 25 HYDROXY (VIT D DEFICIENCY, FRACTURES): Vit D, 25-Hydroxy: 36 ng/mL (ref 30–89)

## 2012-08-01 LAB — CANCER ANTIGEN 27.29: CA 27.29: 23 U/mL (ref 0–39)

## 2012-10-22 ENCOUNTER — Ambulatory Visit (INDEPENDENT_AMBULATORY_CARE_PROVIDER_SITE_OTHER): Payer: BC Managed Care – PPO | Admitting: General Surgery

## 2012-10-22 ENCOUNTER — Encounter (INDEPENDENT_AMBULATORY_CARE_PROVIDER_SITE_OTHER): Payer: Self-pay | Admitting: General Surgery

## 2012-10-22 VITALS — BP 112/84 | HR 88 | Temp 97.8°F | Resp 16 | Ht 64.0 in | Wt 136.0 lb

## 2012-10-22 DIAGNOSIS — Z853 Personal history of malignant neoplasm of breast: Secondary | ICD-10-CM

## 2012-10-22 NOTE — Progress Notes (Signed)
Subjective:     Patient ID: Candace Wells, female   DOB: 1950/11/22, 62 y.o.   MRN: [id number]  HPI This is a 62 year old female who I took care of and did a left simple mastectomy and axillary sentinel lymph node biopsy for a stage I breast cancer in the past. She did not tolerate the tamoxifen. She was being followed and developed a right breast cancer. This was a stage 0 ductal carcinoma in situ that was treated with a right simple mastectomy and a sentinel lymph node biopsy as well. On the left side she had postmastectomy pain syndrome which caused a lot of trouble. At the time of that operation she had bilateral expander then converted implant based reconstruction. Her postmastectomy pain syndrome is essentially gone down she feels much better. She has no real complaints referable to any masses on her own self exam. She does state that the left breast is somewhat larger and the is displaced laterally. She is seeing Dr. Odis Luster for this and is considering some more surgery to achieve symmetry. She is due to see Dr. Darnelle Catalan next week for discussion of any further adjuvant therapy. She tried tamoxifen again it was really unable to tolerate it due to significant vaginal wetness.  Review of Systems     Objective:   Physical Exam  Constitutional: She appears well-developed and well-nourished.  Pulmonary/Chest: Right breast exhibits no mass, no skin change and no tenderness. Left breast exhibits no mass, no skin change and no tenderness.    Lymphadenopathy:    She has no cervical adenopathy.    She has no axillary adenopathy.       Right: No supraclavicular adenopathy present.       Left: No supraclavicular adenopathy present.       Assessment:     Stage I left breast cancer treated with mastectomy, sentinel node biopsy Stage 0 right breast cancer treated with mastectomy, sentinel node biopsy    Plan:     I am very happy here today that her pain issues are essentially resolved from  the postmastectomy pain syndrome. She has some more minor issues with her left-sided implant today she is seeing Dr. Odis Luster for that. She has no clinical evidence of any recurrence right now. I told her that I would see her annually. I will see her at this point in conjunction with Dr. Darnelle Catalan. I asked her to continue her on self exams.

## 2012-10-29 ENCOUNTER — Other Ambulatory Visit (HOSPITAL_BASED_OUTPATIENT_CLINIC_OR_DEPARTMENT_OTHER): Payer: BC Managed Care – PPO | Admitting: Lab

## 2012-10-29 DIAGNOSIS — C50919 Malignant neoplasm of unspecified site of unspecified female breast: Secondary | ICD-10-CM

## 2012-10-29 LAB — COMPREHENSIVE METABOLIC PANEL (CC13)
Albumin: 4 g/dL (ref 3.5–5.0)
CO2: 28 mEq/L (ref 22–29)
Chloride: 106 mEq/L (ref 98–107)
Glucose: 101 mg/dl — ABNORMAL HIGH (ref 70–99)
Potassium: 4.1 mEq/L (ref 3.5–5.1)
Sodium: 140 mEq/L (ref 136–145)
Total Protein: 7.1 g/dL (ref 6.4–8.3)

## 2012-10-29 LAB — CBC WITH DIFFERENTIAL/PLATELET
Basophils Absolute: 0 10*3/uL (ref 0.0–0.1)
Eosinophils Absolute: 0.1 10*3/uL (ref 0.0–0.5)
HGB: 12.9 g/dL (ref 11.6–15.9)
MCV: 91.3 fL (ref 79.5–101.0)
NEUT#: 4.9 10*3/uL (ref 1.5–6.5)
RDW: 13.9 % (ref 11.2–14.5)
lymph#: 0.9 10*3/uL (ref 0.9–3.3)

## 2012-11-05 ENCOUNTER — Ambulatory Visit (HOSPITAL_BASED_OUTPATIENT_CLINIC_OR_DEPARTMENT_OTHER): Payer: BC Managed Care – PPO | Admitting: Oncology

## 2012-11-05 ENCOUNTER — Telehealth: Payer: Self-pay | Admitting: Oncology

## 2012-11-05 VITALS — BP 111/75 | HR 111 | Temp 97.5°F | Resp 20 | Ht 64.0 in | Wt 140.1 lb

## 2012-11-05 DIAGNOSIS — M899 Disorder of bone, unspecified: Secondary | ICD-10-CM

## 2012-11-05 DIAGNOSIS — C50919 Malignant neoplasm of unspecified site of unspecified female breast: Secondary | ICD-10-CM

## 2012-11-05 DIAGNOSIS — D0590 Unspecified type of carcinoma in situ of unspecified breast: Secondary | ICD-10-CM

## 2012-11-05 MED ORDER — ANASTROZOLE 1 MG PO TABS: 1.0000 mg | ORAL_TABLET | Freq: Every day | ORAL | Status: DC

## 2012-11-05 NOTE — Progress Notes (Signed)
ID: [name]   DOB: 09-08-1951  MR#: [id number]  WUJ#:[id number]  PCP: Daisy Floro, MD GYN: Konrad Dolores SU: Emelia Loron OTHER MD: Etter Sjogren  HISTORY OF PRESENT ILLNESS: Candace Wells had "busy breasts" and has had multiple biopsies in both breasts, primarily the left breast, where biopsies in February of 2001 and August of 2001 showed ADH.  Most recently, she palpated a mass in her left breast which showed it to be quite dense.  The patient reported a palpable mass in the 12 o'clock position, approximately 1-2 cm superior to the nipple, and this was confirmed on exam by Dr. Jean Rosenthal.  Diagnostic mammography on March 31st showed again very stable breasts with no areas of distortion or microcalcifications.  Mammograms were negative.  However, the ultrasound showed hypoechoic solid mass in the area in question measuring up to 1 cm.  There was no shadowing associated with this.  Biopsy was obtained on April 5th under ultrasound guidance, and this showed ([id number]) an invasive breast cancer which was ER "positive" at 5%, PR negative at 0% with a proliferation marker of 49%, and HER-2 negative by CISH with a ratio of 1.26.    With this information, the patient was referred to Dr. Dwain Sarna and given the fact that she had had multiple breast biopsies previously, the patient opted for left mastectomy with sentinel lymph node sampling.  Breast MRI showed only the mass in question and it measured 3.2 cm by MRI. The definitive surgery was performed on April 21st and showed ([id number]) an invasive ductal carcinoma, grade 3; measuring between 1.5 and 2.0 cm, with 0 of 3 sentinel lymph nodes involved.  There was no lymphovascular invasion and margins were ample.  Her subsequent history is as detailed below  INTERVAL HISTORY: Candace Wells returns today for followup of her bilateral breast carcinomas. Since her last visit here she completed her reconstruction. She is not entirely satisfied, because  there is a lack of symmetry, and the left breast seems to move around in the chest, and hangs differently from the other one. At the same time she is not sure she wants any more surgery at this point.  REVIEW OF SYSTEMS: She gave tamoxifen a second chance, but it again cause bothersome vaginal wetness and she had to discontinue it. She has occasional hip discomfort, this is very intermittent, not more common or intense than previously, and likely related to her having to stand up at work for many hours. Otherwise a detailed review of systems today was entirely noncontributory.  PAST MEDICAL HISTORY: Past Medical History  Diagnosis Date  . Osteoporosis   . Cancer 2010     lt BREAST    PAST SURGICAL HISTORY: Past Surgical History  Procedure Date  . Tonsillectomy   . Dilation and curettage of uterus   . Breast surgery 2010    left mastectomy and snbx  . Breast biopsy 02/28/2012    Procedure: BREAST BIOPSY WITH NEEDLE LOCALIZATION;  Surgeon: Emelia Loron, MD;  Location: [facility];  Service: General;  Laterality: Right;  Right breast wire guided biopsy  . Breast reconstruction 04/23/2012    Procedure: BREAST RECONSTRUCTION;  Surgeon: Etter Sjogren, MD;  Location: [facility];  Service: Plastics;  Laterality: Bilateral;  bilateral breast reconstruction with Tissue Expander placement    FAMILY HISTORY Family History  Problem Relation Age of Onset  . Cancer Mother     breast  . Cancer Father     colon  .  Hypertension Father   . Hypertension Maternal Grandmother   . Heart disease Maternal Grandfather   The patient's father is alive at age 26.  Her mother was diagnosed with breast cancer at age 64 and died of a bone marrow transplant for breast cancer at age 54.  The patient has one sister and one brother, both alive and in good health.  There is considerable breast cancer history on the patient's mother's side. The patient's mother's sister had breast cancer.   The patient's maternal grandmother and three of her sisters and her brother all had breast cancer according to the patient.  Nevertheless, the patient has been tested for BRCA-1 and she tells me she has been found to be negative.  GYNECOLOGIC HISTORY: The patient is GX P3, first pregnancy to term at age 43.  She went through the change of life about 2004.  She did not take hormone replacement.    SOCIAL HISTORY: She works as a Interior and spatial designer.  Her husband of >40 years, [name] - is working currently as a Research scientist (medical) and is semiretired.  The patient's daughter [name] has four children and is married to [name], one of our ER physicians.  They live in [place].  Daughter [name] is getting her PhD at [employer].  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  She is divorced and has a son.  Daughter [name] lives in [place] and works there as a Interior and spatial designer.  The patient is a [employer].     ADVANCED DIRECTIVES:  HEALTH MAINTENANCE: History  Substance Use Topics  . Smoking status: Never Smoker   . Smokeless tobacco: Never Used  . Alcohol Use: No     Colonoscopy:  PAP:  Bone density:  Lipid panel:  Allergies  Allergen Reactions  . Tetracyclines & Related Hives    Current Outpatient Prescriptions  Medication Sig Dispense Refill  . ALPRAZolam (XANAX) 0.25 MG tablet Take 1 tablet (0.25 mg total) by mouth 2 (two) times daily as needed for anxiety.  30 tablet  0  . amitriptyline (ELAVIL) 10 MG tablet daily.      . Calcium Carbonate-Vitamin D (CALCIUM + D PO) Take by mouth daily.        OBJECTIVE: Middle-aged white woman who appears well Filed Vitals:   11/05/12 0959  BP: 111/75  Pulse: 111  Temp: 97.5 F (36.4 C)  Resp: 20     Body mass index is 24.05 kg/(m^2).    ECOG FS: 0 Filed Weights   11/05/12 0959  Weight: 140 lb 1.6 oz (63.549 kg)   Sclerae unicteric Oropharynx clear No cervical or supraclavicular adenopathy Lungs no rales or rhonchi Heart  regular rate and rhythm Abd soft, nontender, with positive bowel sounds MSK no focal spinal tenderness,  No peripheral edema Neuro: nonfocal, alert and oriented x3 Breasts: She status post bilateral mastectomies. The right breast shows some irregularity in the contour laterally, which is displeasing to her. The left breast has a better contour, but the implant seems to role to the axilla when she lies on that side and then again medially when she stands. There is no evidence of local recurrence. Both axillae are benign.  LAB RESULTS: Lab Results  Component Value Date   WBC 6.2 10/29/2012   NEUTROABS 4.9 10/29/2012   HGB 12.9 10/29/2012   HCT 38.3 10/29/2012   MCV 91.3 10/29/2012   PLT 236 10/29/2012      Chemistry      Component Value  Date/Time   NA 140 10/29/2012 1424   NA 139 04/18/2012 1600   K 4.1 10/29/2012 1424   K 3.9 04/18/2012 1600   CL 106 10/29/2012 1424   CL 101 04/18/2012 1600   CO2 28 10/29/2012 1424   CO2 28 04/18/2012 1600   BUN 19.0 10/29/2012 1424   BUN 18 04/18/2012 1600   CREATININE 0.8 10/29/2012 1424   CREATININE 0.72 04/18/2012 1600      Component Value Date/Time   CALCIUM 9.6 10/29/2012 1424   CALCIUM 9.6 04/18/2012 1600   ALKPHOS 96 10/29/2012 1424   ALKPHOS 99 07/12/2011 1412   ALKPHOS 99 07/12/2011 1412   AST 24 10/29/2012 1424   AST 29 07/12/2011 1412   AST 29 07/12/2011 1412   ALT 20 10/29/2012 1424   ALT 31 07/12/2011 1412   ALT 31 07/12/2011 1412   BILITOT 0.32 10/29/2012 1424   BILITOT 0.5 07/12/2011 1412   BILITOT 0.5 07/12/2011 1412       Lab Results  Component Value Date   LABCA2 17 10/29/2012    STUDIES: No results found.  ASSESSMENT: 62 y.o. BRCA 1-2 negative [place] woman   (1) status post left mastectomy and sentinel lymph node dissection in April 2010 for a T1c N0, stage IA invasive ductal carcinoma, grade 3, ER "positive" at 5%, PR and HER2/neu negative, with an MIB-1 of 49%; treated adjuvantly with 4 cycles of dose dense doxorubicin/  cyclophosphamide completed June 2010.  On tamoxifen very briefly, discontinued due to intolerance.  Decided to forego antiestrogen therapy as well due to her history of osteopenia.   (2) status post right simple mastectomy in July 2013 for a high-grade ductal carcinoma in situ, strongly estrogen and progesterone receptor positive, both at 100%, grade 3, with clear margins. 0 of one lymph node was involved.  (3)  completedf implant reconstruction bilaterally under the care of Dr. Odis Luster 08/21/2012  (4) attempted tamoxifen x2, both times developing significant vaginal discharge leading to discontinuation  PLAN: She is not satisfied with the reconstruction (or better put she is "only 60% satisfied"), but at the same time she is really not sure that she wants to do more surgery. She is interested in seeking a second opinion and we are referring her to Madaline Savage at [facility] for further discussion. As far as her breast cancer recurrence is concerned, her tumor was only very weakly estrogen receptor positive.  Nevertheless if she could tolerate an antiestrogen, perhaps that would give [place] a slight edge in terms of decreasing recurrence. We went over the possible toxicities, side effects and complications of anastrozole, and she is interested in giving it a try. If she tolerates it well, when she sees [place] again in 3 months, we will discuss zolendronic acid/ Reclast. She knows to call for any other problems that may develop before the next visit.   Samari Gorby C    11/05/2012

## 2012-11-05 NOTE — Telephone Encounter (Signed)
gv pt appt schedule for April. Pt aware HIM will call re appt @ [facility]. S/w Tiffany in HIM re [facility] appt - no referral entered.

## 2012-11-06 ENCOUNTER — Telehealth: Payer: Self-pay | Admitting: Oncology

## 2012-11-06 NOTE — Telephone Encounter (Signed)
Called pt to give her time and date of appt with Dr. Biagio Borg @ [facility].  Pt will be out of town that week of appt. Pt will call Dr. Jerre Simon office to schedule the appt. I faxed medical records to Dr. Rudean Haskell

## 2012-11-08 ENCOUNTER — Ambulatory Visit: Payer: BC Managed Care – PPO | Admitting: Oncology

## 2013-02-04 ENCOUNTER — Encounter: Payer: Self-pay | Admitting: Oncology

## 2013-02-04 ENCOUNTER — Ambulatory Visit (HOSPITAL_BASED_OUTPATIENT_CLINIC_OR_DEPARTMENT_OTHER): Payer: BC Managed Care – PPO | Admitting: Oncology

## 2013-02-04 VITALS — BP 116/72 | HR 109 | Temp 97.7°F | Resp 20 | Ht 64.0 in | Wt 137.2 lb

## 2013-02-04 DIAGNOSIS — D0590 Unspecified type of carcinoma in situ of unspecified breast: Secondary | ICD-10-CM

## 2013-02-04 DIAGNOSIS — C50919 Malignant neoplasm of unspecified site of unspecified female breast: Secondary | ICD-10-CM

## 2013-02-04 DIAGNOSIS — M810 Age-related osteoporosis without current pathological fracture: Secondary | ICD-10-CM

## 2013-02-04 DIAGNOSIS — C50912 Malignant neoplasm of unspecified site of left female breast: Secondary | ICD-10-CM

## 2013-02-04 DIAGNOSIS — Z170 Estrogen receptor positive status [ER+]: Secondary | ICD-10-CM

## 2013-02-04 NOTE — Progress Notes (Signed)
ID: Candace Wells   DOB: [birth year]  MR#: [id number]  WUJ#:[id number]  PCP: Daisy Floro, MD GYN: Konrad Dolores SU: Emelia Loron OTHER MD: Etter Sjogren, Madaline Savage  HISTORY OF PRESENT ILLNESS: Candace Wells had "busy breasts" and has had multiple biopsies in both breasts, primarily the left breast, where biopsies in February of 2001 and August of 2001 showed ADH.  Most recently, she palpated a mass in her left breast which showed it to be quite dense.  The patient reported a palpable mass in the 12 o'clock position, approximately 1-2 cm superior to the nipple, and this was confirmed on exam by Dr. Jean Rosenthal.  Diagnostic mammography on March 31st showed again very stable breasts with no areas of distortion or microcalcifications.  Mammograms were negative.  However, the ultrasound showed hypoechoic solid mass in the area in question measuring up to 1 cm.  There was no shadowing associated with this.  Biopsy was obtained on April 5th under ultrasound guidance, and this showed ([id number]) an invasive breast cancer which was ER "positive" at 5%, PR negative at 0% with a proliferation marker of 49%, and HER-2 negative by CISH with a ratio of 1.26.    With this information, the patient was referred to Dr. Dwain Sarna and given the fact that she had had multiple breast biopsies previously, the patient opted for left mastectomy with sentinel lymph node sampling.  Breast MRI showed only the mass in question and it measured 3.2 cm by MRI. The definitive surgery was performed on April 21st and showed ([id number]) an invasive ductal carcinoma, grade 3; measuring between 1.5 and 2.0 cm, with 0 of 3 sentinel lymph nodes involved.  There was no lymphovascular invasion and margins were ample.  Her subsequent history is as detailed below  INTERVAL HISTORY: Candace Wells returns today for followup of her bilateral breast carcinomas. Since her last visit here she had breast reconstruction revision at [facility], and is very  pleased with initial results. She never did start anastrozole, and that was again discussed in detail today.  REVIEW OF SYSTEMS: She tolerated the revision procedures without any unusual complications. She denies headaches, visual changes, cough, from production, pleurisy, shortness of breath, chest pain or pressure, or change in bowel or bladder habits. A detailed review of systems today was noncontributory.  PAST MEDICAL HISTORY: Past Medical History  Diagnosis Date  . Osteoporosis   . Cancer 2010     lt BREAST    PAST SURGICAL HISTORY: Past Surgical History  Procedure Laterality Date  . Tonsillectomy    . Dilation and curettage of uterus    . Breast surgery  2010    left mastectomy and snbx  . Breast biopsy  02/28/2012    Procedure: BREAST BIOPSY WITH NEEDLE LOCALIZATION;  Surgeon: Emelia Loron, MD;  Location: [facility];  Service: General;  Laterality: Right;  Right breast wire guided biopsy  . Breast reconstruction  04/23/2012    Procedure: BREAST RECONSTRUCTION;  Surgeon: Etter Sjogren, MD;  Location: [facility];  Service: Plastics;  Laterality: Bilateral;  bilateral breast reconstruction with Tissue Expander placement    FAMILY HISTORY Family History  Problem Relation Age of Onset  . Cancer Mother     breast  . Cancer Father     colon  . Hypertension Father   . Hypertension Maternal Grandmother   . Heart disease Maternal Grandfather   The patient's father is alive at age 76.  Her mother was diagnosed with breast cancer at  age 50 and died of a bone marrow transplant for breast cancer at age 55.  The patient has one sister and one brother, both alive and in good health.  There is considerable breast cancer history on the patient's mother's side. The patient's mother's sister had breast cancer.  The patient's maternal grandmother and three of her sisters and her brother all had breast cancer according to the patient.  Nevertheless, the patient  has been tested for BRCA-1 and she tells me she has been found to be negative.  GYNECOLOGIC HISTORY: The patient is GX P3, first pregnancy to term at age 83.  She went through the change of life about 2004.  She did not take hormone replacement.    SOCIAL HISTORY: She works as a Interior and spatial designer.  Her husband of >40 years, [name] - is working currently as a Research scientist (medical) and is semiretired.  The patient's daughter [name] has four children and is married to [name], one of our ER physicians. They live in [place].  Daughter [name] is getting her PhD at [employer].  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  She is divorced and has a son.  Daughter [name] lives in [place] and works there as a Interior and spatial designer.  The patient is a Mormon.     ADVANCED DIRECTIVES:  HEALTH MAINTENANCE: History  Substance Use Topics  . Smoking status: Never Smoker   . Smokeless tobacco: Never Used  . Alcohol Use: No     Colonoscopy:  PAP:  Bone density:  Lipid panel:  Allergies  Allergen Reactions  . Tetracyclines & Related Hives    Current Outpatient Prescriptions  Medication Sig Dispense Refill  . ALPRAZolam (XANAX) 0.25 MG tablet Take 1 tablet (0.25 mg total) by mouth 2 (two) times daily as needed for anxiety.  30 tablet  0  . amitriptyline (ELAVIL) 10 MG tablet daily.      Marland Kitchen anastrozole (ARIMIDEX) 1 MG tablet Take 1 tablet (1 mg total) by mouth daily.  90 tablet  12  . Calcium Carbonate-Vitamin D (CALCIUM + D PO) Take by mouth daily.       No current facility-administered medications for this visit.    OBJECTIVE: Middle-aged white woman who appears well Filed Vitals:   02/04/13 1336  BP: 116/72  Pulse: 109  Temp: 97.7 F (36.5 C)  Resp: 20     Body mass index is 23.54 kg/(m^2).    ECOG FS: 0 Filed Weights   02/04/13 1336  Weight: 137 lb 3.2 oz (62.234 kg)   Sclerae unicteric Oropharynx clear No cervical or supraclavicular adenopathy Lungs no rales or  rhonchi Heart regular rate and rhythm Abd soft, nontender, with positive bowel sounds MSK no focal spinal tenderness,  No peripheral edema Neuro: nonfocal, well oriented, pleasant affect Breasts: She status post bilateral mastectomies with implant reconstruction. The left breast is slightly larger than the right, and she is planning on "light dose option" to bring them more into line. There is no evidence of local recurrence. Both axillae are benign.  LAB RESULTS: Lab Results  Component Value Date   WBC 6.2 10/29/2012   NEUTROABS 4.9 10/29/2012   HGB 12.9 10/29/2012   HCT 38.3 10/29/2012   MCV 91.3 10/29/2012   PLT 236 10/29/2012      Chemistry      Component Value Date/Time   NA 140 10/29/2012 1424   NA 139 04/18/2012 1600   K 4.1 10/29/2012 1424   K 3.9  04/18/2012 1600   CL 106 10/29/2012 1424   CL 101 04/18/2012 1600   CO2 28 10/29/2012 1424   CO2 28 04/18/2012 1600   BUN 19.0 10/29/2012 1424   BUN 18 04/18/2012 1600   CREATININE 0.8 10/29/2012 1424   CREATININE 0.72 04/18/2012 1600      Component Value Date/Time   CALCIUM 9.6 10/29/2012 1424   CALCIUM 9.6 04/18/2012 1600   ALKPHOS 96 10/29/2012 1424   ALKPHOS 99 07/12/2011 1412   AST 24 10/29/2012 1424   AST 29 07/12/2011 1412   ALT 20 10/29/2012 1424   ALT 31 07/12/2011 1412   BILITOT 0.32 10/29/2012 1424   BILITOT 0.5 07/12/2011 1412       Lab Results  Component Value Date   LABCA2 17 10/29/2012    STUDIES: No results found.  ASSESSMENT: 62 y.o. BRCA 1-2 negative [place] woman   (1) status post left mastectomy and sentinel lymph node dissection in April 2010 for a T1c N0, stage IA invasive ductal carcinoma, grade 3, ER "positive" at 5%, PR and HER2/neu negative, with an MIB-1 of 49%; treated adjuvantly with 4 cycles of dose dense doxorubicin/ cyclophosphamide completed June 2010.  On tamoxifen very briefly, discontinued due to intolerance. Decided to forego aromatase inhibitors due to her history of osteopenia.   (2) status post  right simple mastectomy in July 2013 for a high-grade ductal carcinoma in situ, strongly estrogen and progesterone receptor positive, both at 100%, grade 3, with clear margins. This single sentinel lymph node was clear.  (3)  completedf implant reconstruction bilaterally under the care of Dr. Odis Luster 08/21/2012  (4) attempted tamoxifen x2, both times developing significant vaginal discharge leading to discontinuation  PLAN: We again discussed adjuvant antiestrogen, and the adjuvant! Program would quote her a risk of recurrence in the 14% range, which she can cut in half by taking anastrozole for 5 years. She has a very good understanding of the possible toxicities, side effects and complications of aromatase inhibitors, including concerns regarding worsening osteopenia. Of course that problem can be separately addressed with bisphosphonates (which carry there onset of possible side effects).  At this point she is leaning towards observation alone, but she still has the prescription and she will "give it some more thought".   Salene Mohamud C    02/04/2013

## 2013-02-06 ENCOUNTER — Telehealth: Payer: Self-pay | Admitting: *Deleted

## 2013-02-06 NOTE — Telephone Encounter (Signed)
Lm gv appt d/t for 10.23.14 @ 2:30pm. I also made pt aware that i will mail a letter/cal as well...td

## 2013-03-04 ENCOUNTER — Other Ambulatory Visit: Payer: Self-pay | Admitting: *Deleted

## 2013-03-04 DIAGNOSIS — C50919 Malignant neoplasm of unspecified site of unspecified female breast: Secondary | ICD-10-CM

## 2013-03-04 NOTE — Progress Notes (Signed)
Pt called to this RN stating ongoing Right hip pain " seems to be getting a bit worse " discussed at last office visit. Note both hips have discomfort with increase notable increase in right hip.  Per discussion xray of hips will be obtained and due to discomfort both hips will be obtained .

## 2013-03-05 ENCOUNTER — Ambulatory Visit (HOSPITAL_COMMUNITY)
Admission: RE | Admit: 2013-03-05 | Discharge: 2013-03-05 | Disposition: A | Discharge status: Home / Self Care | Payer: BC Managed Care – PPO | Source: Ambulatory Visit | Attending: Oncology | Admitting: Oncology

## 2013-03-05 DIAGNOSIS — M25559 Pain in unspecified hip: Secondary | ICD-10-CM | POA: Insufficient documentation

## 2013-03-05 DIAGNOSIS — C50919 Malignant neoplasm of unspecified site of unspecified female breast: Secondary | ICD-10-CM

## 2013-03-05 DIAGNOSIS — Z853 Personal history of malignant neoplasm of breast: Secondary | ICD-10-CM | POA: Insufficient documentation

## 2013-03-05 DIAGNOSIS — M25569 Pain in unspecified knee: Secondary | ICD-10-CM | POA: Insufficient documentation

## 2013-04-02 ENCOUNTER — Ambulatory Visit (INDEPENDENT_AMBULATORY_CARE_PROVIDER_SITE_OTHER): Payer: BC Managed Care – PPO | Admitting: General Surgery

## 2013-05-13 ENCOUNTER — Encounter (INDEPENDENT_AMBULATORY_CARE_PROVIDER_SITE_OTHER): Payer: Self-pay | Admitting: General Surgery

## 2013-05-13 ENCOUNTER — Ambulatory Visit (INDEPENDENT_AMBULATORY_CARE_PROVIDER_SITE_OTHER): Payer: BC Managed Care – PPO | Admitting: General Surgery

## 2013-05-13 VITALS — BP 110/68 | HR 68 | Resp 20 | Ht 64.5 in | Wt 140.0 lb

## 2013-05-13 DIAGNOSIS — C50919 Malignant neoplasm of unspecified site of unspecified female breast: Secondary | ICD-10-CM

## 2013-05-13 NOTE — Progress Notes (Signed)
Subjective:     Patient ID: Candace Wells, female   DOB: January 10, 1951, 62 y.o.   MRN: [id number]  HPI 76 yof well known to me from  left mastectomy and sentinel lymph node biopsy in April 2010 for a T1c N0, stage IA invasive ductal carcinoma, grade 3, ER "positive" at 5%, PR and HER2/neu negative, with an MIB-1 of 49%; treated adjuvantly with 4 cycles of dose dense doxorubicin/ cyclophosphamide completed June 2010. On tamoxifen very briefly, discontinued due to intolerance. Decided to forego aromatase inhibitors due to her history of osteopenia. She has decided to stay off of these in last visit with Dr Darnelle Catalan due to concern for worsening osteoporosis. She is also status post right simple mastectomy in July 2013 for a high-grade ductal carcinoma in situ, strongly estrogen and progesterone receptor positive, both at 100%, grade 3, with clear margins. This single sentinel lymph node was clear. She had trouble with implants and has gone to [facility] and had revision.  She is happier after this and result is better.  Her postmastectomy pain on the left side is still gone.  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   Review of Systems     Objective:   Physical Exam  Constitutional: She appears well-developed and well-nourished.  Pulmonary/Chest: Right breast exhibits no mass, no skin change and no tenderness. Left breast exhibits no mass, no skin change and no tenderness.    Lymphadenopathy:    She has no cervical adenopathy.    She has no axillary adenopathy.       Right: No supraclavicular adenopathy present.       Left: No supraclavicular adenopathy present.       Assessment:     History he stage I left breast cancer, history stage 0 right breast cancer    Plan:     She is doing very well has no evidence of any recurrence. She is much happier after her revision of her reconstruction. I discussed with her massage and the area of tightness in her right axilla. She is  very comfortable with the decision to forego any antiestrogen therapy and I think this is reasonable also given pathology.  She will need to be seen for exams every 6 months and I told her I be happy to examine her on an annual basis with Dr. Darnelle Catalan

## 2013-06-18 ENCOUNTER — Telehealth: Payer: Self-pay | Admitting: Oncology

## 2013-06-18 NOTE — Telephone Encounter (Signed)
Returned pt's call re r/s appt. lmonvm for pt to call back to let us know when she can come in.

## 2013-06-18 NOTE — Telephone Encounter (Signed)
Pt called to r/s 10/23 appt to 11/3. Pt has new d/t.

## 2013-08-08 ENCOUNTER — Ambulatory Visit: Payer: BC Managed Care – PPO | Admitting: Oncology

## 2013-08-20 ENCOUNTER — Telehealth: Payer: Self-pay | Admitting: Oncology

## 2013-08-20 ENCOUNTER — Ambulatory Visit (HOSPITAL_BASED_OUTPATIENT_CLINIC_OR_DEPARTMENT_OTHER): Payer: BC Managed Care – PPO | Admitting: Oncology

## 2013-08-20 VITALS — BP 116/74 | HR 85 | Temp 98.1°F | Resp 20 | Ht 64.5 in | Wt 139.5 lb

## 2013-08-20 DIAGNOSIS — R002 Palpitations: Secondary | ICD-10-CM

## 2013-08-20 DIAGNOSIS — C50919 Malignant neoplasm of unspecified site of unspecified female breast: Secondary | ICD-10-CM

## 2013-08-20 DIAGNOSIS — Z853 Personal history of malignant neoplasm of breast: Secondary | ICD-10-CM

## 2013-08-20 NOTE — Progress Notes (Signed)
ID: Candace Wells   DOB: 03-28-1951  MR#: [id number]  WUJ#:[id number]  PCP: Miguel Aschoff, MD GYN: Konrad Dolores SU: Emelia Loron OTHER MD: Etter Sjogren, Madaline Savage  HISTORY OF PRESENT ILLNESS: Candace Wells had "busy breasts" and has had multiple biopsies in both breasts, primarily the left breast, where biopsies in February of 2001 and August of 2001 showed ADH.  Most recently, she palpated a mass in her left breast which showed it to be quite dense.  The patient reported a palpable mass in the 12 o'clock position, approximately 1-2 cm superior to the nipple, and this was confirmed on exam by Dr. Jean Rosenthal.  Diagnostic mammography on March 31st showed again very stable breasts with no areas of distortion or microcalcifications.  Mammograms were negative.  However, the ultrasound showed hypoechoic solid mass in the area in question measuring up to 1 cm.  There was no shadowing associated with this.  Biopsy was obtained on April 5th under ultrasound guidance, and this showed ([id number]) an invasive breast cancer which was ER "positive" at 5%, PR negative at 0% with a proliferation marker of 49%, and HER-2 negative by CISH with a ratio of 1.26.    With this information, the patient was referred to Dr. Dwain Sarna and given the fact that she had had multiple breast biopsies previously, the patient opted for left mastectomy with sentinel lymph node sampling.  Breast MRI showed only the mass in question and it measured 3.2 cm by MRI. The definitive surgery was performed on April 21st and showed ([id number]) an invasive ductal carcinoma, grade 3; measuring between 1.5 and 2.0 cm, with 0 of 3 sentinel lymph nodes involved.  There was no lymphovascular invasion and margins were ample.  Her subsequent history is as detailed below  INTERVAL HISTORY: Candace Wells returns today for followup of her bilateral breast carcinomas. After the last visit here, she thought some more about whether or not to take anastrozole, and  has made a final decision that she is not going to take any anti-estrogens. She is comfortable with this.  REVIEW OF SYSTEMS: She is not completely pleased with her breast reconstruction, but what I'm not having any more surgery". Accordingly she started going to the gym. She would notice when she grafts a hold of the exercise machine that her a slight heart rate can be as high as 120. It rapidly rises to over 140. Sometimes she notices a rapid heart rate when she goes to bed. This does not seem to be clearly exercise related. She thinks she is a bit short of breath, but this is not obviously correlated with the palpitations. She does not had a check her pulse but has not been doing that. She cannot tell me if the heartbeats are irregular or regular when she notices them. Aside from this issue, a detailed review of systems was noncontributory  PAST MEDICAL HISTORY: Past Medical History  Diagnosis Date  . Osteoporosis   . Cancer 2010     lt BREAST    PAST SURGICAL HISTORY: Past Surgical History  Procedure Laterality Date  . Tonsillectomy    . Dilation and curettage of uterus    . Breast surgery  2010    left mastectomy and snbx  . Breast biopsy  02/28/2012    Procedure: BREAST BIOPSY WITH NEEDLE LOCALIZATION;  Surgeon: Emelia Loron, MD;  Location: [facility];  Service: General;  Laterality: Right;  Right breast wire guided biopsy  . Breast reconstruction  04/23/2012  Procedure: BREAST RECONSTRUCTION;  Surgeon: Etter Sjogren, MD;  Location: [facility];  Service: Plastics;  Laterality: Bilateral;  bilateral breast reconstruction with Tissue Expander placement    FAMILY HISTORY Family History  Problem Relation Age of Onset  . Cancer Mother     breast  . Cancer Father     colon  . Hypertension Father   . Hypertension Maternal Grandmother   . Heart disease Maternal Grandfather   The patient's father is alive at age 25.  Her mother was diagnosed with  breast cancer at age 44 and died of a bone marrow transplant for breast cancer at age 20.  The patient has one sister and one brother, both alive and in good health.  There is considerable breast cancer history on the patient's mother's side. The patient's mother's sister had breast cancer.  The patient's maternal grandmother and three of her sisters and her brother all had breast cancer according to the patient.  Nevertheless, the patient has been tested for BRCA-1 and she tells me she has been found to be negative.  GYNECOLOGIC HISTORY: The patient is GX P3, first pregnancy to term at age 27.  She went through the change of life about 2004.  She did not take hormone replacement.    SOCIAL HISTORY: She works as a Interior and spatial designer.  Her husband of >40 years, [name] - is working currently as a Research scientist (medical) and is semiretired.  The patient's daughter [name] has four children and is married to [name], one of our ER physicians. They live in [place].  Daughter [name] is getting her PhD at [employer].  She does some work with [employer] as a Research scientist (medical).  She is divorced and has a son.  Daughter [name] lives in [place] and works there as a Interior and spatial designer.  The patient is a [employer].      ADVANCED DIRECTIVES: Not in place  HEALTH MAINTENANCE: History  Substance Use Topics  . Smoking status: Never Smoker   . Smokeless tobacco: Never Used  . Alcohol Use: No     Colonoscopy:  PAP:  Bone density:  Lipid panel:  Allergies  Allergen Reactions  . Tetracyclines & Related Hives    Current Outpatient Prescriptions  Medication Sig Dispense Refill  . ALPRAZolam (XANAX) 0.25 MG tablet Take 1 tablet (0.25 mg total) by mouth 2 (two) times daily as needed for anxiety.  30 tablet  0  . Calcium Carbonate-Vitamin D (CALCIUM + D PO) Take by mouth daily.       No current facility-administered medications for this visit.    OBJECTIVE: Middle-aged white woman in no acute distress Filed  Vitals:   08/20/13 1155  BP: 116/74  Pulse: 85  Temp: 98.1 F (36.7 C)  Resp: 20     Body mass index is 23.58 kg/(m^2).    ECOG FS: 1 Filed Weights   08/20/13 1155  Weight: 139 lb 8 oz (63.277 kg)   Sclerae unicteric Oropharynx clear No cervical or supraclavicular adenopathy Lungs no rales or rhonchi Heart regular rate and rhythm, no murmur appreciated Abd soft, nontender, with positive bowel sounds MSK no focal spinal tenderness,  No upper extremity lymphedema Neuro: nonfocal, well oriented, pleasant affect Breasts: She status post bilateral mastectomies with implant reconstruction. The left breast is slightly larger than the right. There is no evidence of local recurrence. Both axillae are benign.  LAB RESULTS: Lab Results  Component Value Date   WBC 6.2 10/29/2012   NEUTROABS 4.9  10/29/2012   HGB 12.9 10/29/2012   HCT 38.3 10/29/2012   MCV 91.3 10/29/2012   PLT 236 10/29/2012      Chemistry      Component Value Date/Time   NA 140 10/29/2012 1424   NA 139 04/18/2012 1600   K 4.1 10/29/2012 1424   K 3.9 04/18/2012 1600   CL 106 10/29/2012 1424   CL 101 04/18/2012 1600   CO2 28 10/29/2012 1424   CO2 28 04/18/2012 1600   BUN 19.0 10/29/2012 1424   BUN 18 04/18/2012 1600   CREATININE 0.8 10/29/2012 1424   CREATININE 0.72 04/18/2012 1600      Component Value Date/Time   CALCIUM 9.6 10/29/2012 1424   CALCIUM 9.6 04/18/2012 1600   ALKPHOS 96 10/29/2012 1424   ALKPHOS 99 07/12/2011 1412   AST 24 10/29/2012 1424   AST 29 07/12/2011 1412   ALT 20 10/29/2012 1424   ALT 31 07/12/2011 1412   BILITOT 0.32 10/29/2012 1424   BILITOT 0.5 07/12/2011 1412       Lab Results  Component Value Date   LABCA2 17 10/29/2012    STUDIES: No results found.  ASSESSMENT: 62 y.o. BRCA 1-2 negative [place] woman   (1) status post left mastectomy and sentinel lymph node dissection in April 2010 for a T1c N0, stage IA invasive ductal carcinoma, grade 3, ER "positive" at 5%, PR and HER2/neu negative, with an  MIB-1 of 49%; treated adjuvantly with 4 cycles of dose dense doxorubicin/ cyclophosphamide completed June 2010.  On tamoxifen very briefly, discontinued due to intolerance. Decided to forego aromatase inhibitors due to her history of osteopenia.   (2) status post right simple mastectomy in July 2013 for a high-grade ductal carcinoma in situ, strongly estrogen and progesterone receptor positive, both at 100%, grade 3, with clear margins. The single sentinel lymph node was clear.  (3)  completedf implant reconstruction bilaterally under the care of Dr. Odis Luster 08/21/2012  (4) attempted tamoxifen x2, both times developing significant vaginal discharge leading to discontinuation; decided against adjuvant antiestrogen therapy  PLAN: Candace Wells's go to be 5 years out from her definitive surgery for the invasive cancer she had on the left may of next year. I will see her at that time and likely will discharge her from followup, since the cancer on the right was essentially cured with her mastectomy.  It is difficult for me to make much sense of her palpitations, which may be due to anxiety rather than a primary cardiac issue, but just to be safe I am going to refer her to cardiology for further evaluation.  Candace Wells knows to call for any problems that may develop before her next visit here.   MAGRINAT,GUSTAV C    08/20/2013

## 2013-08-20 NOTE — Telephone Encounter (Signed)
, °

## 2013-08-27 ENCOUNTER — Ambulatory Visit (INDEPENDENT_AMBULATORY_CARE_PROVIDER_SITE_OTHER): Payer: BC Managed Care – PPO | Admitting: Cardiology

## 2013-08-27 ENCOUNTER — Encounter: Payer: Self-pay | Admitting: Cardiology

## 2013-08-27 VITALS — BP 110/72 | HR 98 | Ht 64.5 in | Wt 139.8 lb

## 2013-08-27 DIAGNOSIS — C50919 Malignant neoplasm of unspecified site of unspecified female breast: Secondary | ICD-10-CM

## 2013-08-27 DIAGNOSIS — R0602 Shortness of breath: Secondary | ICD-10-CM

## 2013-08-27 DIAGNOSIS — R002 Palpitations: Secondary | ICD-10-CM

## 2013-08-27 DIAGNOSIS — I499 Cardiac arrhythmia, unspecified: Secondary | ICD-10-CM

## 2013-08-27 NOTE — Patient Instructions (Signed)
**Note De-Identified Candace Wells Obfuscation** Your physician has requested that you have an echocardiogram. Echocardiography is a painless test that uses sound waves to create images of your heart. It provides your doctor with information about the size and shape of your heart and how well your heart's chambers and valves are working. This procedure takes approximately one hour. There are no restrictions for this procedure.  Your physician has requested that you have an exercise tolerance test. For further information please visit [email or web address] Please also follow instruction sheet, as given.  Your physician has recommended that you wear a 48 hour holter monitor. Holter monitors are medical devices that record the heart's electrical activity. Doctors most often use these monitors to diagnose arrhythmias. Arrhythmias are problems with the speed or rhythm of the heartbeat. The monitor is a small, portable device. You can wear one while you do your normal daily activities. This is usually used to diagnose what is causing palpitations/syncope (passing out).  Your physician recommends that you return for lab work in: today  Your physician recommends that you schedule a follow-up appointment in: after tests

## 2013-08-27 NOTE — Progress Notes (Signed)
Patient ID: Candace Wells, female   DOB: August 06, 1951, 62 y.o.   MRN: [id number]    Patient Name: Candace Wells Date of Encounter: 08/27/2013  Primary Care Provider:  Miguel Aschoff, MD Primary Cardiologist:  Tobias Alexander, H  Problem List   Past Medical History  Diagnosis Date  . Osteoporosis   . Cancer 2010     lt BREAST   Past Surgical History  Procedure Laterality Date  . Tonsillectomy    . Dilation and curettage of uterus    . Breast surgery  2010    left mastectomy and snbx  . Breast biopsy  02/28/2012    Procedure: BREAST BIOPSY WITH NEEDLE LOCALIZATION;  Surgeon: Emelia Loron, MD;  Location: [facility];  Service: General;  Laterality: Right;  Right breast wire guided biopsy  . Breast reconstruction  04/23/2012    Procedure: BREAST RECONSTRUCTION;  Surgeon: Etter Sjogren, MD;  Location: [facility];  Service: Plastics;  Laterality: Bilateral;  bilateral breast reconstruction with Tissue Expander placement    Allergies  Allergies  Allergen Reactions  . Tetracyclines & Related Hives    HPI  62 year old female with h/o invasive ductal carcinoma,treated adjuvantly with 4 cycles of dose dense doxorubicin/ cyclophosphamide completed June 2010. She is also status post B/L mastectomy in July 2013 for a high-grade ductal carcinoma in situ with reconstruction surgery revision.  She is doing very well overall and working as Interior and spatial designer, a physically active going to gym multiple times a week. The patient states that she has noticed lately that her heart increases abruptly after only few minutes of exercise and she becomes SOB. She also noticed palpitations when she lays in bed. They last few seconds to minutes. No associated chest pain, dizziness. No h/o syncope. No PND or orthopnea.   Home Medications  Prior to Admission medications   Medication Sig Start Date End Date Taking? Authorizing Provider  ALPRAZolam Prudy Feeler) 0.25 MG tablet Take  0.25 mg by mouth as needed for anxiety. 07/31/12  Yes Amy Allegra Grana, PA-C  Calcium Carbonate-Vitamin D (CALCIUM + D PO) Take by mouth daily.   Yes Historical Provider, MD    Family History  Family History  Problem Relation Age of Onset  . Cancer Mother     breast  . Cancer Father     colon  . Hypertension Father   . Hypertension Maternal Grandmother   . Heart disease Maternal Grandfather     Social History  History   Social History  . Marital Status: Married    Spouse Name: N/A    Number of Children: N/A  . Years of Education: N/A   Occupational History  . Not on file.   Social History Main Topics  . Smoking status: Never Smoker   . Smokeless tobacco: Never Used  . Alcohol Use: No  . Drug Use: No  . Sexual Activity:    Other Topics Concern  . Not on file   Social History Narrative  . No narrative on file     Review of Systems, as per HPI, otherwise negative General:  No chills, fever, night sweats or weight changes.  Cardiovascular:  No chest pain, dyspnea on exertion, edema, orthopnea, palpitations, paroxysmal nocturnal dyspnea. Dermatological: No rash, lesions/masses Respiratory: No cough, dyspnea Urologic: No hematuria, dysuria Abdominal:   No nausea, vomiting, diarrhea, bright red blood per rectum, melena, or hematemesis Neurologic:  No visual changes, wkns, changes in mental status. All other systems reviewed and are  otherwise negative except as noted above.  Physical Exam  Blood pressure 110/72, pulse 98, height 5' 4.5" (1.638 m), weight 139 lb 12.8 oz (63.413 kg).  General: Pleasant, NAD Psych: Normal affect. Neuro: Alert and oriented X 3. Moves all extremities spontaneously. HEENT: Normal  Neck: Supple without bruits or JVD. Lungs:  Resp regular and unlabored, CTA. Heart: RRR no s3, s4, or murmurs. Abdomen: Soft, non-tender, non-distended, BS + x 4.  Extremities: No clubbing, cyanosis or edema. DP/PT/Radials 2+ and equal  bilaterally.  Labs:  No results found for this basename: CKTOTAL, CKMB, TROPONINI,  in the last 72 hours Lab Results  Component Value Date   WBC 6.2 10/29/2012   HGB 12.9 10/29/2012   HCT 38.3 10/29/2012   MCV 91.3 10/29/2012   PLT 236 10/29/2012   No results found for this basename: NA, K, CL, CO2, BUN, CREATININE, CALCIUM, LABALBU, PROT, BILITOT, ALKPHOS, ALT, AST, GLUCOSE,  in the last 168 hours No results found for this basename: CHOL, HDL, LDLCALC, TRIG   No results found for this basename: DDIMER   No components found with this basename: POCBNP,   Accessory Clinical Findings  echocardiogram  ECG - NSR, 83 BPM, normal ECG   Assessment & Plan  62 year old female with h/o breast cancer treated adjuvantly with 4 cycles of dose dense doxorubicin/ cyclophosphamide completed in 2010. Total dose of doxorubicin unknown. No prior echocardiogram. Now symptoms of palpitations and DOE. We will order echocardiogram with strain analysis. 48 Hour Holter monitor and an exercise treadmill stress test. Labs today: CMP and TSH.  Follow up in 1 month.   Lars Masson, MD, [facility] 08/27/2013, 3:59 PM

## 2013-08-27 NOTE — Addendum Note (Signed)
Addended by: Demetrios Loll on: 08/27/2013 04:45 PM   Modules accepted: Orders

## 2013-08-28 LAB — COMPREHENSIVE METABOLIC PANEL
ALT: 22 U/L (ref 0–35)
AST: 26 U/L (ref 0–37)
Albumin: 4.1 g/dL (ref 3.5–5.2)
Alkaline Phosphatase: 68 U/L (ref 39–117)
BUN: 20 mg/dL (ref 6–23)
CO2: 27 mEq/L (ref 19–32)
Calcium: 9.3 mg/dL (ref 8.4–10.5)
Chloride: 106 mEq/L (ref 96–112)
Creatinine, Ser: 0.7 mg/dL (ref 0.4–1.2)
GFR: 94.57 mL/min (ref >60.00)
Glucose, Bld: 89 mg/dL (ref 70–99)
Potassium: 4.2 mEq/L (ref 3.5–5.1)
Sodium: 139 mEq/L (ref 135–145)
Total Bilirubin: 0.7 mg/dL (ref 0.3–1.2)
Total Protein: 6.4 g/dL (ref 6.0–8.3)

## 2013-08-28 LAB — TSH: TSH: 1.43 u[IU]/mL (ref 0.35–5.50)

## 2013-09-03 ENCOUNTER — Encounter: Payer: Self-pay | Admitting: *Deleted

## 2013-09-03 ENCOUNTER — Encounter (INDEPENDENT_AMBULATORY_CARE_PROVIDER_SITE_OTHER): Payer: BC Managed Care – PPO

## 2013-09-03 ENCOUNTER — Other Ambulatory Visit: Payer: Self-pay

## 2013-09-03 DIAGNOSIS — R002 Palpitations: Secondary | ICD-10-CM

## 2013-09-03 DIAGNOSIS — I499 Cardiac arrhythmia, unspecified: Secondary | ICD-10-CM

## 2013-09-03 NOTE — Progress Notes (Signed)
Patient ID: Candace Wells, female   DOB: 04/26/1951, 62 y.o.   MRN: [id number] ECardio 48 hour holter monitor applied to patient.

## 2013-09-09 ENCOUNTER — Encounter: Payer: Self-pay | Admitting: Internal Medicine

## 2013-09-09 ENCOUNTER — Ambulatory Visit (HOSPITAL_COMMUNITY): Payer: BC Managed Care – PPO | Attending: Internal Medicine | Admitting: Radiology

## 2013-09-09 DIAGNOSIS — C50919 Malignant neoplasm of unspecified site of unspecified female breast: Secondary | ICD-10-CM

## 2013-09-09 DIAGNOSIS — R0602 Shortness of breath: Secondary | ICD-10-CM | POA: Insufficient documentation

## 2013-09-09 DIAGNOSIS — R0609 Other forms of dyspnea: Secondary | ICD-10-CM | POA: Insufficient documentation

## 2013-09-09 DIAGNOSIS — R0989 Other specified symptoms and signs involving the circulatory and respiratory systems: Secondary | ICD-10-CM | POA: Insufficient documentation

## 2013-09-09 NOTE — Progress Notes (Signed)
Echocardiogram performed.  

## 2013-09-11 ENCOUNTER — Telehealth: Payer: Self-pay

## 2013-09-11 MED ORDER — METOPROLOL TARTRATE 25 MG PO TABS: ORAL_TABLET | ORAL | Status: DC

## 2013-09-11 NOTE — Telephone Encounter (Signed)
Patient called Dr.Nelson reviewed 48 hr holter monitor which revealed one episode of atrial fib total of 13 beats.Metoprolol 12.5 mg twice a day sent to pharmacy.Appointment scheduled with Dr.Nelson 10/14/13 at 8:30 am.

## 2013-10-07 ENCOUNTER — Ambulatory Visit (INDEPENDENT_AMBULATORY_CARE_PROVIDER_SITE_OTHER): Payer: BC Managed Care – PPO | Admitting: Physician Assistant

## 2013-10-07 DIAGNOSIS — R0602 Shortness of breath: Secondary | ICD-10-CM

## 2013-10-07 NOTE — Progress Notes (Signed)
Exercise Treadmill Test  Pre-Exercise Testing Evaluation Rhythm: normal sinus  Rate: 73     Test  Exercise Tolerance Test Ordering MD: Tobias Alexander  Interpreting MD: Tereso Newcomer, PA-C  Unique Test No: 1  Treadmill:  1  Indication for ETT: SOB  Contraindication to ETT: No   Stress Modality: exercise - treadmill  Cardiac Imaging Performed: non   Protocol: standard Bruce - maximal  Max BP:  154/68  Max MPHR (bpm):  158 85% MPR (bpm):  134  MPHR obtained (bpm):  166 % MPHR obtained:  105  Reached 85% MPHR (min:sec):  4:02 Total Exercise Time (min-sec):  7:00  Workload in METS:  8.5 Borg Scale: 15  Reason ETT Terminated:  desired heart rate attained    ST Segment Analysis At Rest: non-specific ST segment slurring With Exercise: non-specific ST changes  Other Information Arrhythmia:  No Angina during ETT:  absent (0) Quality of ETT:  diagnostic  ETT Interpretation:  normal - no evidence of ischemia by ST analysis  Comments: Good exercise capacity. No chest pain. Normal BP response to exercise. There was up-sloping ST depression.  No significant ST changes to suggest ischemia.   Recommendations: F/u with Dr. Tobias Alexander as directed. Signed,  Tereso Newcomer, PA-C   10/07/2013 11:54 AM

## 2013-10-14 ENCOUNTER — Encounter: Payer: Self-pay | Admitting: Cardiology

## 2013-10-14 ENCOUNTER — Ambulatory Visit (INDEPENDENT_AMBULATORY_CARE_PROVIDER_SITE_OTHER): Payer: BC Managed Care – PPO | Admitting: Cardiology

## 2013-10-14 VITALS — BP 92/60 | HR 70 | Ht 64.5 in | Wt 140.0 lb

## 2013-10-14 DIAGNOSIS — I480 Paroxysmal atrial fibrillation: Secondary | ICD-10-CM | POA: Insufficient documentation

## 2013-10-14 DIAGNOSIS — I499 Cardiac arrhythmia, unspecified: Secondary | ICD-10-CM

## 2013-10-14 DIAGNOSIS — R002 Palpitations: Secondary | ICD-10-CM | POA: Insufficient documentation

## 2013-10-14 MED ORDER — FLECAINIDE ACETATE 100 MG PO TABS: 100.0000 mg | ORAL_TABLET | Freq: Two times a day (BID) | ORAL | Status: DC

## 2013-10-14 MED ORDER — ASPIRIN EC 81 MG PO TBEC: 81.0000 mg | DELAYED_RELEASE_TABLET | Freq: Every day | ORAL | Status: DC

## 2013-10-14 NOTE — Progress Notes (Signed)
Patient ID: Candace Wells, female   DOB: 03-31-1951, 62 y.o.   MRN: [id number]     Patient Name: Candace Wells Date of Encounter: 10/14/2013  Primary Care Provider:  Miguel Aschoff, MD Primary Cardiologist:  Tobias Alexander, H  Problem List   Past Medical History  Diagnosis Date  . Osteoporosis   . Cancer 2010     lt BREAST   Past Surgical History  Procedure Laterality Date  . Tonsillectomy    . Dilation and curettage of uterus    . Breast surgery  2010    left mastectomy and snbx  . Breast biopsy  02/28/2012    Procedure: BREAST BIOPSY WITH NEEDLE LOCALIZATION;  Surgeon: Emelia Loron, MD;  Location: [facility];  Service: General;  Laterality: Right;  Right breast wire guided biopsy  . Breast reconstruction  04/23/2012    Procedure: BREAST RECONSTRUCTION;  Surgeon: Etter Sjogren, MD;  Location:  SUR[facility]ERY [facility];  Service: Plastics;  Laterality: Bilateral;  bilateral breast reconstruction with Tissue Expander placement    Allergies  Allergies  Allergen Reactions  . Tetracyclines & Related Hives    HPI  62 year old female with h/o invasive ductal carcinoma,treated adjuvantly with 4 cycles of dose dense doxorubicin/ cyclophosphamide completed June 2010. She is also status post B/L mastectomy in July 2013 for a high-grade ductal carcinoma in situ with reconstruction surgery revision.  She is doing very well overall and working as Interior and spatial designer, a physically active going to gym multiple times a week. The patient states that she has noticed lately that her heart increases abruptly after only few minutes of exercise and she becomes SOB. She also noticed palpitations when she lays in bed. They last few seconds to minutes. No associated chest pain, dizziness. No h/o syncope. No PND or orthopnea.   Follow up after 1 month. She reports short episodes of tachycardia every day without associated symptoms.  Home Medications  Prior to Admission medications    Medication Sig Start Date End Date Taking? Authorizing Provider  ALPRAZolam Prudy Feeler) 0.25 MG tablet Take 0.25 mg by mouth as needed for anxiety. 07/31/12  Yes Amy Allegra Grana, PA-C  Calcium Carbonate-Vitamin D (CALCIUM + D PO) Take by mouth daily.   Yes Historical Provider, MD    Family History  Family History  Problem Relation Age of Onset  . Cancer Mother     breast  . Cancer Father     colon  . Hypertension Father   . Hypertension Maternal Grandmother   . Heart disease Maternal Grandfather     Social History  History   Social History  . Marital Status: Married    Spouse Name: N/A    Number of Children: N/A  . Years of Education: N/A   Occupational History  . Not on file.   Social History Main Topics  . Smoking status: Never Smoker   . Smokeless tobacco: Never Used  . Alcohol Use: No  . Drug Use: No  . Sexual Activity:    Other Topics Concern  . Not on file   Social History Narrative  . No narrative on file     Review of Systems, as per HPI, otherwise negative General:  No chills, fever, night sweats or weight changes.  Cardiovascular:  No chest pain, dyspnea on exertion, edema, orthopnea, palpitations, paroxysmal nocturnal dyspnea. Dermatological: No rash, lesions/masses Respiratory: No cough, dyspnea Urologic: No hematuria, dysuria Abdominal:   No nausea, vomiting, diarrhea, bright red blood per rectum, melena,  or hematemesis Neurologic:  No visual changes, wkns, changes in mental status. All other systems reviewed and are otherwise negative except as noted above.  Physical Exam  There were no vitals taken for this visit.  General: Pleasant, NAD Psych: Normal affect. Neuro: Alert and oriented X 3. Moves all extremities spontaneously. HEENT: Normal  Neck: Supple without bruits or JVD. Lungs:  Resp regular and unlabored, CTA. Heart: RRR no s3, s4, or murmurs. Abdomen: Soft, non-tender, non-distended, BS + x 4.  Extremities: No clubbing, cyanosis or  edema. DP/PT/Radials 2+ and equal bilaterally.  Labs:  No results found for this basename: CKTOTAL, CKMB, TROPONINI,  in the last 72 hours Lab Results  Component Value Date   WBC 6.2 10/29/2012   HGB 12.9 10/29/2012   HCT 38.3 10/29/2012   MCV 91.3 10/29/2012   PLT 236 10/29/2012   No results found for this basename: NA, K, CL, CO2, BUN, CREATININE, CALCIUM, LABALBU, PROT, BILITOT, ALKPHOS, ALT, AST, GLUCOSE,  in the last 168 hours No results found for this basename: CHOL,  HDL,  LDLCALC,  TRIG   Accessory Clinical Findings  ECG - NSR, 83 BPM, normal ECG  Echocardiogram 10/09/2013  Left ventricle: The cavity size was normal. Wall thickness was normal. Systolic function was normal. The estimated ejection fraction was in the range of 50% to 55%.  Aortic valve: Mildly thickened leaflets. Doppler: No regurgitation.  Mitral valve: Mildly thickened leaflets .  Left atrium: The atrium was normal in size.  Right ventricle: The cavity size was normal. Wall thickness was normal. Systolic function was normal.  Tricuspid valve: Structurally normal valve. Leaflet separation was normal. Doppler: Transvalvular velocity was within the normal range. Trivial regurgitation.  Right atrium: The atrium was normal in size. Pericardium: There was no pericardial effusion.  Exercise treadmill stress test 10/07/13  Reached 85% MPHR (min:sec): 4:02  Total Exercise Time (min-sec): 7:00   Workload in METS: 8.5  Borg Scale: 15   Reason ETT Terminated: desired heart rate attained    ST Segment Analysis  At Rest: non-specific ST segment slurring  With Exercise: non-specific ST changes  Other Information  Arrhythmia: No Angina during ETT: absent (0)  Quality of ETT: diagnostic  ETT Interpretation: normal - no evidence of ischemia by ST analysis  Comments:  Good exercise capacity.  No chest pain.  Normal BP response to exercise.  There was up-sloping ST depression. No significant ST changes to  suggest ischemia.    Assessment & Plan  62 year old female with h/o breast cancer treated adjuvantly with 4 cycles of dose dense doxorubicin/ cyclophosphamide completed in 2010. Total dose of doxorubicin unknown.  1. Palpitations - 48-Hour Holter monitor showed a short episode of atrial fibrillation, she reports episode like that almost daily without associated symptoms. Her CHADS-VASc is 1, we will start her on aspirin 81 mg po daily. She is feeling fatigued after Metoprolol, but reports significant improvement of symptoms. Her baseline BP is 90 mmHg. We will discontinue metoprolol and start flecainide 100 mg po BID (normal Crea) and repeat exercise tredamill stress test in 3-4 weeks to assess for QT prolongation. TSH and lytes normal.  2. DOE - normal LVEF, good exercise capacity, normal exercise stress test, no evidence of ischemia. Her DOE is not related to her cardiovascular system.   Follow up in 2 month.   Lars Masson, MD, [facility] 10/14/2013, 8:43 AM

## 2013-10-14 NOTE — Patient Instructions (Signed)
**Note De-Identified Cheron Coryell Obfuscation** Your physician has requested that you have an exercise tolerance test. For further information please visit [email or web address] Please also follow instruction sheet, as given.  Your physician has recommended you make the following change in your medication: stop taking Metoprolol and start taking Flecainide 100 mg twice daily and Aspirin 81 mg daily  Your physician recommends that you schedule a follow-up appointment in: 2 months

## 2013-11-07 ENCOUNTER — Telehealth: Payer: Self-pay | Admitting: Cardiology

## 2013-11-07 MED ORDER — METOPROLOL TARTRATE 12.5 MG HALF TABLET: 12.5000 mg | ORAL_TABLET | Freq: Two times a day (BID) | ORAL | Status: DC

## 2013-11-07 NOTE — Telephone Encounter (Signed)
Pt cancelled treadmill scheduled for 11/18/2013, she has not started the new medication and she has question about the medication and the stress test and would like to talk with someone before she rescheduled the stress test, please give pt a call, thanks

## 2013-11-07 NOTE — Telephone Encounter (Signed)
New message        Pt has questions about her medication side effects

## 2013-11-07 NOTE — Telephone Encounter (Signed)
**Note De-Identified Candace Wells Obfuscation** Per Dr Meda Coffee the pt is advised to resume Metoprolol 12.5 mg BID, not to take Flecainide and that the GXT is no longer needed since the pt is not changing to Flecainide. She verbalized understanding.

## 2013-11-07 NOTE — Telephone Encounter (Signed)
Pt advised, she verbalized understanding. 

## 2013-11-08 ENCOUNTER — Other Ambulatory Visit: Payer: Self-pay

## 2013-11-08 MED ORDER — METOPROLOL TARTRATE 12.5 MG HALF TABLET: 12.5000 mg | ORAL_TABLET | Freq: Two times a day (BID) | ORAL | Status: DC

## 2013-11-18 ENCOUNTER — Encounter: Payer: BC Managed Care – PPO | Admitting: Physician Assistant

## 2013-12-23 ENCOUNTER — Telehealth: Payer: Self-pay | Admitting: *Deleted

## 2013-12-23 ENCOUNTER — Ambulatory Visit: Payer: BC Managed Care – PPO | Admitting: Cardiology

## 2013-12-23 NOTE — Telephone Encounter (Signed)
Pt called to cancel her appt for 02/24/14 and top rs for 03/04/14 wilth labs@ 9am and ov@ 9:30am...td

## 2014-01-02 ENCOUNTER — Encounter: Payer: Self-pay | Admitting: *Deleted

## 2014-01-13 ENCOUNTER — Encounter: Payer: Self-pay | Admitting: Cardiology

## 2014-01-13 ENCOUNTER — Ambulatory Visit (INDEPENDENT_AMBULATORY_CARE_PROVIDER_SITE_OTHER): Payer: BC Managed Care – PPO | Admitting: Cardiology

## 2014-01-13 VITALS — BP 122/60 | HR 62 | Ht 64.5 in | Wt 140.0 lb

## 2014-01-13 DIAGNOSIS — I4891 Unspecified atrial fibrillation: Secondary | ICD-10-CM

## 2014-01-13 DIAGNOSIS — F411 Generalized anxiety disorder: Secondary | ICD-10-CM

## 2014-01-13 DIAGNOSIS — R002 Palpitations: Secondary | ICD-10-CM

## 2014-01-13 DIAGNOSIS — F419 Anxiety disorder, unspecified: Secondary | ICD-10-CM

## 2014-01-13 MED ORDER — METOPROLOL TARTRATE 12.5 MG HALF TABLET: 12.5000 mg | ORAL_TABLET | Freq: Two times a day (BID) | ORAL | Status: DC

## 2014-01-13 MED ORDER — ALPRAZOLAM 0.25 MG PO TABS: 0.2500 mg | ORAL_TABLET | ORAL | Status: DC | PRN

## 2014-01-13 NOTE — Patient Instructions (Signed)
Your physician recommends that you return for lab work in: tomorrow morning  Your physician wants you to follow-up in: 1 year. You will receive a reminder letter in the mail two months in advance. If you don't receive a letter, please call our office to schedule the follow-up appointment.

## 2014-01-13 NOTE — Progress Notes (Signed)
Patient ID: [name], female   DOB: July 09, 1951, 63 y.o.   MRN: [id number]    Patient Name: Candace Wells Date of Encounter: 01/13/2014  Primary Care Provider:  Gus Height, MD Primary Cardiologist:  Dorothy Spark  Problem List   Past Medical History  Diagnosis Date  . Osteoporosis   . Breast cancer 2010    left   Past Surgical History  Procedure Laterality Date  . Tonsillectomy    . Dilation and curettage of uterus    . Breast surgery  2010    left mastectomy and snbx  . Breast biopsy  02/28/2012    Procedure: BREAST BIOPSY WITH NEEDLE LOCALIZATION;  Surgeon: Rolm Bookbinder, MD;  Location: [place];  Service: General;  Laterality: Right;  Right breast wire guided biopsy  . Breast reconstruction  04/23/2012    Procedure: BREAST RECONSTRUCTION;  Surgeon: Crissie Reese, MD;  Location: [place];  Service: Plastics;  Laterality: Bilateral;  bilateral breast reconstruction with Tissue Expander placement    Allergies  Allergies  Allergen Reactions  . Tetracyclines & Related Hives    HPI  63 year old female with h/o invasive ductal carcinoma,treated adjuvantly with 4 cycles of dose dense doxorubicin/ cyclophosphamide completed June 2010. She is also status post B/L mastectomy in July 2013 for a high-grade ductal carcinoma in situ with reconstruction surgery revision.  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 The patient states that she has noticed lately that her heart increases abruptly after only few minutes of exercise and she becomes SOB. She also noticed palpitations when she lays in bed. They last few seconds to minutes. No associated chest pain, dizziness. No h/o syncope. No PND or orthopnea.   Follow up after 2 months. She decided not to use Flecainide for the side effect profile, but is using Metoprolol 6.25 mg po BID with significant improvement of her  palpitations.  She only has minimal palpitations at night that are resolved with xanax.  Home Medications  Prior to Admission medications   Medication Sig Start Date End Date Taking? Authorizing Provider  ALPRAZolam Duanne Moron) 0.25 MG tablet Take 0.25 mg by mouth as needed for anxiety. 07/31/12  Yes Amy Milda Smart, PA-C  Calcium Carbonate-Vitamin D (CALCIUM + D PO) Take by mouth daily.   Yes Historical Provider, MD    Family History  Family History  Problem Relation Age of Onset  . Breast cancer Mother   . Colon cancer Father   . Hypertension Father   . Hypertension Maternal Grandmother   . Heart disease Maternal Grandfather     Social History  History   Social History  . Marital Status: Married    Spouse Name: N/A    Number of Children: N/A  . Years of Education: N/A   Occupational History  . Not on file.   Social History Main Topics  . Smoking status: Never Smoker   . Smokeless tobacco: Never Used  . Alcohol Use: No  . Drug Use: No  . Sexual Activity:    Other Topics Concern  . Not on file   Social History Narrative  . No narrative on file     Review of Systems, as per HPI, otherwise negative General:  No chills, fever, night sweats or weight changes.  Cardiovascular:  No chest pain, dyspnea on exertion, edema, orthopnea, palpitations, paroxysmal nocturnal dyspnea. Dermatological: No rash, lesions/masses Respiratory: No  cough, dyspnea Urologic: No hematuria, dysuria Abdominal:   No nausea, vomiting, diarrhea, bright red blood per rectum, melena, or hematemesis Neurologic:  No visual changes, wkns, changes in mental status. All other systems reviewed and are otherwise negative except as noted above.  Physical Exam  Blood pressure 122/60, pulse 62, height 5' 4.5" (1.638 m), weight 140 lb (63.504 kg).  General: Pleasant, NAD Psych: Normal affect. Neuro: Alert and oriented X 3. Moves all extremities spontaneously. HEENT: Normal  Neck: Supple without bruits or  JVD. Lungs:  Resp regular and unlabored, CTA. Heart: RRR no s3, s4, or murmurs. Abdomen: Soft, non-tender, non-distended, BS + x 4.  Extremities: No clubbing, cyanosis or edema. DP/PT/Radials 2+ and equal bilaterally.  Labs:  No results found for this basename: CKTOTAL, CKMB, TROPONINI,  in the last 72 hours Lab Results  Component Value Date   WBC 6.2 10/29/2012   HGB 12.9 10/29/2012   HCT 38.3 10/29/2012   MCV 91.3 10/29/2012   PLT 236 10/29/2012   No results found for this basename: NA, K, CL, CO2, BUN, CREATININE, CALCIUM, LABALBU, PROT, BILITOT, ALKPHOS, ALT, AST, GLUCOSE,  in the last 168 hours No results found for this basename: CHOL,  HDL,  LDLCALC,  TRIG   Accessory Clinical Findings  ECG - NSR, 83 BPM, normal ECG  Echocardiogram 10/09/2013  Left ventricle: The cavity size was normal. Wall thickness was normal. Systolic function was normal. The estimated ejection fraction was in the range of 50% to 55%.  Aortic valve: Mildly thickened leaflets. Doppler: No regurgitation.  Mitral valve: Mildly thickened leaflets .  Left atrium: The atrium was normal in size.  Right ventricle: The cavity size was normal. Wall thickness was normal. Systolic function was normal.  Tricuspid valve: Structurally normal valve. Leaflet separation was normal. Doppler: Transvalvular velocity was within the normal range. Trivial regurgitation.  Right atrium: The atrium was normal in size. Pericardium: There was no pericardial effusion.  Exercise treadmill stress test 10/07/13  Reached 85% MPHR (min:sec): 4:02  Total Exercise Time (min-sec): 7:00   Workload in METS: 8.5  Borg Scale: 15   Reason ETT Terminated: desired heart rate attained    ST Segment Analysis  At Rest: non-specific ST segment slurring  With Exercise: non-specific ST changes  Other Information  Arrhythmia: No Angina during ETT: absent (0)  Quality of ETT: diagnostic  ETT Interpretation: normal - no evidence of ischemia  by ST analysis  Comments:  Good exercise capacity.  No chest pain.  Normal BP response to exercise.  There was up-sloping ST depression. No significant ST changes to suggest ischemia.    Assessment & Plan  63 year old female with h/o breast cancer treated adjuvantly with 4 cycles of dose dense doxorubicin/ cyclophosphamide completed in 2010. Total dose of doxorubicin unknown.  1. Palpitations - 48-Hour Holter monitor showed a short episode of atrial fibrillation, she reports episode like that almost daily without associated symptoms. Her CHADS-VASc is 1, we will start her on aspirin 81 mg po daily. She is feeling fatigued after Metoprolol, but reports significant improvement of symptoms. Her baseline BP is 90 mmHg. We will  metoprolol 6.25 po BID. BP today 122/66 mmHg. TSH and lytes normal. She is encouraged to exercise on a regular basis and give yoga a try as it might help with anxiety.  2. DOE - normal LVEF, good exercise capacity, normal exercise stress test, no evidence of ischemia. Her DOE is not related to her cardiovascular system.   3.  Lipids - we will check today with CMP.  Follow up in 1 year.Dorothy Spark, MD, [facility] 01/13/2014, 10:00 AM

## 2014-02-24 ENCOUNTER — Other Ambulatory Visit: Payer: BC Managed Care – PPO

## 2014-02-24 ENCOUNTER — Ambulatory Visit: Payer: BC Managed Care – PPO | Admitting: Oncology

## 2014-03-04 ENCOUNTER — Ambulatory Visit (HOSPITAL_BASED_OUTPATIENT_CLINIC_OR_DEPARTMENT_OTHER): Payer: BC Managed Care – PPO | Admitting: Oncology

## 2014-03-04 ENCOUNTER — Other Ambulatory Visit: Payer: Self-pay | Admitting: *Deleted

## 2014-03-04 ENCOUNTER — Other Ambulatory Visit (HOSPITAL_BASED_OUTPATIENT_CLINIC_OR_DEPARTMENT_OTHER): Payer: BC Managed Care – PPO

## 2014-03-04 VITALS — BP 108/67 | HR 75 | Temp 98.4°F | Resp 18 | Ht 64.5 in | Wt 143.2 lb

## 2014-03-04 DIAGNOSIS — I4891 Unspecified atrial fibrillation: Secondary | ICD-10-CM

## 2014-03-04 DIAGNOSIS — Z170 Estrogen receptor positive status [ER+]: Secondary | ICD-10-CM

## 2014-03-04 DIAGNOSIS — Z9010 Acquired absence of unspecified breast and nipple: Secondary | ICD-10-CM

## 2014-03-04 DIAGNOSIS — C50919 Malignant neoplasm of unspecified site of unspecified female breast: Secondary | ICD-10-CM

## 2014-03-04 DIAGNOSIS — Z853 Personal history of malignant neoplasm of breast: Secondary | ICD-10-CM

## 2014-03-04 DIAGNOSIS — D0590 Unspecified type of carcinoma in situ of unspecified breast: Secondary | ICD-10-CM

## 2014-03-04 NOTE — Progress Notes (Signed)
ID: Candace Wells   DOB: 1951-05-10  MR#: [id number]  IOM#:[id number]  PCP: Candace Height, MD GYN: Candace Wells SU: Candace Wells OTHER MD: Candace Wells, Candace Wells  HISTORY OF PRESENT ILLNESS: Candace Wells had "busy breasts" and has had multiple biopsies in both breasts, primarily the left breast, where biopsies in February of 2001 and August of 2001 showed ADH.  Most recently, she palpated a mass in her left breast which showed it to be quite dense.  The patient reported a palpable mass in the 12 o'clock position, approximately 1-2 cm superior to the nipple, and this was confirmed on exam by Candace Wells.  Diagnostic mammography on March 31st showed again very stable breasts with no areas of distortion or microcalcifications.  Mammograms were negative.  However, the ultrasound showed hypoechoic solid mass in the area in question measuring up to 1 cm.  There was no shadowing associated with this.  Biopsy was obtained on April 5th under ultrasound guidance, and this showed ([id number] and [id number]) an invasive breast cancer which was ER "positive" at 5%, PR negative at 0% with a proliferation marker of 49%, and HER-2 negative by CISH with a ratio of 1.26.    With this information, the patient was referred to Candace Wells and given the fact that she had had multiple breast biopsies previously, the patient opted for left mastectomy with sentinel lymph node sampling.  Breast MRI showed only the mass in question and it measured 3.2 cm by MRI. The definitive surgery was performed on April 21st and showed ([id number]) an invasive ductal carcinoma, grade 3; measuring between 1.5 and 2.0 cm, with 0 of 3 sentinel lymph nodes involved.  There was no lymphovascular invasion and margins were ample.  Her subsequent history is as detailed below  INTERVAL HISTORY: Candace Wells returns today for followup of her bilateral breast carcinomas. The interval history since her last visit here is unremarkable. She is not exercising regularly  but is planning to join [facility]" which is near her home and not expensive.  REVIEW OF SYSTEMS: She saw cardiology regarding her palpitations and has been started on metoprolol, with good control. She is worried about weight gain. Aside from that a detailed review of systems today was noncontributory.  PAST MEDICAL HISTORY: Past Medical History  Diagnosis Date  . Osteoporosis   . Breast cancer 2010    left    PAST SURGICAL HISTORY: Past Surgical History  Procedure Laterality Date  . Tonsillectomy    . Dilation and curettage of uterus    . Breast surgery  2010    left mastectomy and snbx  . Breast biopsy  02/28/2012    Procedure: BREAST BIOPSY WITH NEEDLE LOCALIZATION;  Surgeon: Candace Bookbinder, MD;  Location: [place];  Service: General;  Laterality: Right;  Right breast wire guided biopsy  . Breast reconstruction  04/23/2012    Procedure: BREAST RECONSTRUCTION;  Surgeon: Candace Reese, MD;  Location: [place];  Service: Plastics;  Laterality: Bilateral;  bilateral breast reconstruction with Tissue Expander placement    FAMILY HISTORY Family History  Problem Relation Age of Onset  . Breast cancer Mother   . Colon cancer Father   . Hypertension Father   . Hypertension Maternal Grandmother   . Heart disease Maternal Grandfather   The patient's father is alive at age 65.  Her mother was diagnosed with breast cancer at age 88 and died of a bone marrow transplant for breast cancer at age 40.  The patient  has one sister and one brother, both alive and in good health.  There is considerable breast cancer history on the patient's mother's side. The patient's mother's sister had breast cancer.  The patient's maternal grandmother and three of her sisters and her brother all had breast cancer according to the patient.  Nevertheless, the patient has been tested for BRCA-1 and she tells me she has been found to be negative.  GYNECOLOGIC HISTORY: The  patient is GX P3, first pregnancy to term at age 28.  She went through the change of life about 2004.  She did not take hormone replacement.    SOCIAL HISTORY: She works as a Theme park manager.  Her husband of >40 years, Candace Wells "Candace Wells" - is working currently as a Optometrist and is semiretired.  The patient's daughter Candace Wells has four children and is married to Candace Wells, one of our ER physicians. They live in [place].  Daughter Candace Wells is getting her PhD at [employer].  She does some work with Candace Wells as a Optometrist.  She is divorced and has a son.  Daughter Candace Wells lives in [place] and works there as a Theme park manager.  The patient is a [employer].      ADVANCED DIRECTIVES: Not in place  HEALTH MAINTENANCE: History  Substance Use Topics  . Smoking status: Never Smoker   . Smokeless tobacco: Never Used  . Alcohol Use: No     Colonoscopy:  PAP:  Bone density:  Lipid panel:  Allergies  Allergen Reactions  . Tetracyclines & Related Hives    Current Outpatient Prescriptions  Medication Sig Dispense Refill  . ALPRAZolam (XANAX) 0.25 MG tablet Take 1 tablet (0.25 mg total) by mouth as needed for anxiety.  30 tablet  2  . aspirin EC 81 MG tablet Take 1 tablet (81 mg total) by mouth daily.  90 tablet  3  . Calcium Carbonate-Vitamin D (CALCIUM + D PO) Take by mouth daily.      . metoprolol tartrate (LOPRESSOR) 12.5 mg TABS tablet Take 0.5 tablets (12.5 mg total) by mouth 2 (two) times daily.  90 tablet  3   No current facility-administered medications for this visit.    OBJECTIVE: Middle-aged white woman who appears well Filed Vitals:   03/04/14 0928  BP: 108/67  Pulse: 75  Temp: 98.4 F (36.9 C)  Resp: 18     Body mass index is 24.21 kg/(m^2).    ECOG FS: 0 Filed Weights   03/04/14 0928  Weight: 143 lb 3.2 oz (64.955 kg)   Sclerae unicteric, pupils round and equal Oropharynx clear and moist No cervical or supraclavicular adenopathy Lungs no rales or rhonchi Heart  regular rate and rhythm today Abd soft, nontender, with positive bowel sounds MSK no focal spinal tenderness, no upper extremity lymphedema Neuro: nonfocal, well oriented, pleasant affect Breasts:status post bilateral mastectomies with implant reconstruction. The left breast is slightly larger than the right. There is no evidence of local recurrence on either side. Both axillae are benign.  LAB RESULTS: Lab Results  Component Value Date   WBC 6.2 10/29/2012   NEUTROABS 4.9 10/29/2012   HGB 12.9 10/29/2012   HCT 38.3 10/29/2012   MCV 91.3 10/29/2012   PLT 236 10/29/2012      Chemistry      Component Value Date/Time   NA 139 08/27/2013 1633   NA 140 10/29/2012 1424   K 4.2 08/27/2013 1633   K 4.1 10/29/2012 1424   CL 106 08/27/2013 1633  CL 106 10/29/2012 1424   CO2 27 08/27/2013 1633   CO2 28 10/29/2012 1424   BUN 20 08/27/2013 1633   BUN 19.0 10/29/2012 1424   CREATININE 0.7 08/27/2013 1633   CREATININE 0.8 10/29/2012 1424      Component Value Date/Time   CALCIUM 9.3 08/27/2013 1633   CALCIUM 9.6 10/29/2012 1424   ALKPHOS 68 08/27/2013 1633   ALKPHOS 96 10/29/2012 1424   AST 26 08/27/2013 1633   AST 24 10/29/2012 1424   ALT 22 08/27/2013 1633   ALT 20 10/29/2012 1424   BILITOT 0.7 08/27/2013 1633   BILITOT 0.32 10/29/2012 1424       Lab Results  Component Value Date   LABCA2 17 10/29/2012    STUDIES: No results found.  ASSESSMENT: 63 y.o. BRCA negative [place] woman   (1) status post left mastectomy and sentinel lymph node dissection in April 2010 for a T1c N0, stage IA invasive ductal carcinoma, grade 3, ER "positive" at 5%, PR and HER2/neu negative, with an MIB-1 of 49%; treated adjuvantly with 4 cycles of dose dense doxorubicin/ cyclophosphamide completed June 2010.  On tamoxifen very briefly, discontinued due to intolerance. Decided to forego aromatase inhibitors due to her history of osteopenia.   (2) status post right simple mastectomy in July 2013 for a high-grade  ductal carcinoma in situ, strongly estrogen and progesterone receptor positive, both at 100%, grade 3, with clear margins. The single sentinel lymph node was clear.  (3)  completedf implant reconstruction bilaterally under the care of Dr. Harlow Mares 08/21/2012  (4) attempted tamoxifen x2, both times developing significant vaginal discharge leading to discontinuation; decided against adjuvant antiestrogen therapy  PLAN: Candace Wells is a little over 5 years out from her definitive left mastectomy, with no evidence of disease recurrence. She is 2 years out from her mastectomy for noninvasive disease, but she understands mastectomy generally cures ductal carcinoma in situ. She has an excellent chance of her invasive cancer never coming back.  At this point I feel very comfortable releasing her to her primary care physician's care. She understands all she will need is a yearly physician breast exam as far as breast cancer followup is concerned.  Of course we will be glad to see Candace Wells again at any point in the future if the need arises, but as of now we're making no further routine appointment for her here.    Candace Wells    03/04/2014

## 2014-05-06 ENCOUNTER — Encounter (INDEPENDENT_AMBULATORY_CARE_PROVIDER_SITE_OTHER): Payer: Self-pay | Admitting: General Surgery

## 2014-08-01 ENCOUNTER — Observation Stay (HOSPITAL_COMMUNITY): Payer: BC Managed Care – PPO

## 2014-08-01 ENCOUNTER — Encounter (HOSPITAL_COMMUNITY): Payer: Self-pay | Admitting: Emergency Medicine

## 2014-08-01 ENCOUNTER — Emergency Department (HOSPITAL_COMMUNITY): Payer: BC Managed Care – PPO

## 2014-08-01 ENCOUNTER — Observation Stay (HOSPITAL_COMMUNITY)
Admission: EM | Admit: 2014-08-01 | Discharge: 2014-08-02 | Disposition: A | Discharge status: Home / Self Care | Payer: BC Managed Care – PPO | Attending: Internal Medicine | Admitting: Internal Medicine

## 2014-08-01 DIAGNOSIS — M810 Age-related osteoporosis without current pathological fracture: Secondary | ICD-10-CM | POA: Diagnosis not present

## 2014-08-01 DIAGNOSIS — C50919 Malignant neoplasm of unspecified site of unspecified female breast: Secondary | ICD-10-CM

## 2014-08-01 DIAGNOSIS — R202 Paresthesia of skin: Secondary | ICD-10-CM | POA: Diagnosis present

## 2014-08-01 DIAGNOSIS — I480 Paroxysmal atrial fibrillation: Secondary | ICD-10-CM | POA: Diagnosis present

## 2014-08-01 DIAGNOSIS — R531 Weakness: Secondary | ICD-10-CM | POA: Diagnosis present

## 2014-08-01 DIAGNOSIS — Z79899 Other long term (current) drug therapy: Secondary | ICD-10-CM | POA: Diagnosis not present

## 2014-08-01 DIAGNOSIS — C50112 Malignant neoplasm of central portion of left female breast: Secondary | ICD-10-CM | POA: Diagnosis present

## 2014-08-01 DIAGNOSIS — I10 Essential (primary) hypertension: Secondary | ICD-10-CM | POA: Insufficient documentation

## 2014-08-01 DIAGNOSIS — G459 Transient cerebral ischemic attack, unspecified: Secondary | ICD-10-CM

## 2014-08-01 DIAGNOSIS — Z853 Personal history of malignant neoplasm of breast: Secondary | ICD-10-CM | POA: Insufficient documentation

## 2014-08-01 DIAGNOSIS — Z7982 Long term (current) use of aspirin: Secondary | ICD-10-CM | POA: Diagnosis not present

## 2014-08-01 DIAGNOSIS — E785 Hyperlipidemia, unspecified: Secondary | ICD-10-CM

## 2014-08-01 DIAGNOSIS — Z170 Estrogen receptor positive status [ER+]: Secondary | ICD-10-CM | POA: Diagnosis present

## 2014-08-01 HISTORY — DX: Palpitations: R00.2

## 2014-08-01 LAB — TROPONIN I

## 2014-08-01 LAB — BASIC METABOLIC PANEL
Anion gap: 12 (ref 5–15)
BUN: 20 mg/dL (ref 6–23)
CHLORIDE: 101 meq/L (ref 96–112)
CO2: 28 mEq/L (ref 19–32)
Calcium: 9.8 mg/dL (ref 8.4–10.5)
Creatinine, Ser: 0.74 mg/dL (ref 0.50–1.10)
GFR, EST NON AFRICAN AMERICAN: 89 mL/min — AB (ref >90)
GLUCOSE: 115 mg/dL — AB (ref 70–99)
POTASSIUM: 4.2 meq/L (ref 3.7–5.3)
SODIUM: 141 meq/L (ref 137–147)

## 2014-08-01 LAB — CBC WITH DIFFERENTIAL/PLATELET
Basophils Absolute: 0 10*3/uL (ref 0.0–0.1)
Basophils Relative: 0 % (ref 0–1)
Eosinophils Absolute: 0.1 10*3/uL (ref 0.0–0.7)
Eosinophils Relative: 1 % (ref 0–5)
HCT: 39.2 % (ref 36.0–46.0)
HEMOGLOBIN: 13.2 g/dL (ref 12.0–15.0)
LYMPHS ABS: 1.3 10*3/uL (ref 0.7–4.0)
LYMPHS PCT: 18 % (ref 12–46)
MCH: 31.1 pg (ref 26.0–34.0)
MCHC: 33.7 g/dL (ref 30.0–36.0)
MCV: 92.5 fL (ref 78.0–100.0)
MONOS PCT: 6 % (ref 3–12)
Monocytes Absolute: 0.4 10*3/uL (ref 0.1–1.0)
NEUTROS PCT: 75 % (ref 43–77)
Neutro Abs: 5.5 10*3/uL (ref 1.7–7.7)
PLATELETS: 226 10*3/uL (ref 150–400)
RBC: 4.24 MIL/uL (ref 3.87–5.11)
RDW: 13.3 % (ref 11.5–15.5)
WBC: 7.3 10*3/uL (ref 4.0–10.5)

## 2014-08-01 MED ORDER — ACETAMINOPHEN 325 MG PO TABS
650.0000 mg | ORAL_TABLET | Freq: Once | ORAL | Status: AC
  Administered 2014-08-01: 650 mg via ORAL
  Filled 2014-08-01: qty 2

## 2014-08-01 MED ORDER — ENOXAPARIN SODIUM 40 MG/0.4ML ~~LOC~~ SOLN
40.0000 mg | SUBCUTANEOUS | Status: DC
  Administered 2014-08-01: 40 mg via SUBCUTANEOUS
  Filled 2014-08-01: qty 0.4

## 2014-08-01 MED ORDER — STROKE: EARLY STAGES OF RECOVERY BOOK
Freq: Once | Status: AC
Start: 2014-08-01 — End: 2014-08-01
  Administered 2014-08-01: 20:00:00
  Filled 2014-08-01: qty 1

## 2014-08-01 MED ORDER — ASPIRIN 325 MG PO TABS
325.0000 mg | ORAL_TABLET | Freq: Every day | ORAL | Status: DC
  Administered 2014-08-02: 325 mg via ORAL
  Filled 2014-08-01: qty 1

## 2014-08-01 MED ORDER — METOPROLOL TARTRATE 12.5 MG HALF TABLET: 12.5000 mg | ORAL_TABLET | Freq: Two times a day (BID) | ORAL | Status: DC

## 2014-08-01 MED ORDER — ASPIRIN EC 325 MG PO TBEC: 325.0000 mg | DELAYED_RELEASE_TABLET | Freq: Every day | ORAL | Status: DC

## 2014-08-01 MED ORDER — ACETAMINOPHEN 325 MG PO TABS: 650.0000 mg | ORAL_TABLET | ORAL | Status: DC | PRN

## 2014-08-01 MED ORDER — LORAZEPAM 0.5 MG PO TABS
0.5000 mg | ORAL_TABLET | Freq: Once | ORAL | Status: AC
  Administered 2014-08-01: 0.5 mg via ORAL
  Filled 2014-08-01: qty 1

## 2014-08-01 NOTE — ED Provider Notes (Signed)
CSN: [id number]     Arrival date & time 08/01/14  1401 History   First MD Initiated Contact with Patient 08/01/14 1406     Chief Complaint  Patient presents with  . Weakness    left arm    HPI Pt was seen at 1405. Per pt, c/o sudden onset and resolution of one episode of left forearm and hand "numbess and tingling" that began approximately 1315 PTA. Was associate with "tight" upper back pain. Pt states her symptoms occurred while walking around a store, and improved approximately 30 minutes later after she sat and rested. Denies CP/palpitations, no SOB/cough, no abd pain, no N/V/D, no rash, no syncope, no visual changes, no focal motor weakness, no ataxia, no slurred speech, no facial droop.    Past Medical History  Diagnosis Date  . Osteoporosis   . Breast cancer 2010    left  . Palpitations   . Atrial fibrillation     on 48 hour Holter monitor   Past Surgical History  Procedure Laterality Date  . Tonsillectomy    . Dilation and curettage of uterus    . Breast surgery  2010    left mastectomy and snbx  . Breast biopsy  02/28/2012    Procedure: BREAST BIOPSY WITH NEEDLE LOCALIZATION;  Surgeon: Rolm Bookbinder, MD;  Location: [facility];  Service: General;  Laterality: Right;  Right breast wire guided biopsy  . Breast reconstruction  04/23/2012    Procedure: BREAST RECONSTRUCTION;  Surgeon: Crissie Reese, MD;  Location: [facility];  Service: Plastics;  Laterality: Bilateral;  bilateral breast reconstruction with Tissue Expander placement   Family History  Problem Relation Age of Onset  . Breast cancer Mother   . Colon cancer Father   . Hypertension Father   . Hypertension Maternal Grandmother   . Heart disease Maternal Grandfather    History  Substance Use Topics  . Smoking status: Never Smoker   . Smokeless tobacco: Never Used  . Alcohol Use: No    Review of Systems ROS: Statement: All systems negative except as marked or noted in the HPI;  Constitutional: Negative for fever and chills. ; ; Eyes: Negative for eye pain, redness and discharge. ; ; ENMT: Negative for ear pain, hoarseness, nasal congestion, sinus pressure and sore throat. ; ; Cardiovascular: Negative for chest pain, palpitations, diaphoresis, dyspnea and peripheral edema. ; ; Respiratory: Negative for cough, wheezing and stridor. ; ; Gastrointestinal: Negative for nausea, vomiting, diarrhea, abdominal pain, blood in stool, hematemesis, jaundice and rectal bleeding. . ; ; Genitourinary: Negative for dysuria, flank pain and hematuria. ; ; Musculoskeletal: +upper back pain. Negative for neck pain. Negative for swelling and trauma.; ; Skin: Negative for pruritus, rash, abrasions, blisters, bruising and skin lesion.; ; Neuro: +paresthesias. Negative for headache, lightheadedness and neck stiffness. Negative for weakness, altered level of consciousness , altered mental status, extremity weakness, involuntary movement, seizure and syncope.     Allergies  Tetracyclines & related  Home Medications   Prior to Admission medications   Medication Sig Start Date End Date Taking? Authorizing Provider  ALPRAZolam (XANAX) 0.25 MG tablet Take 1 tablet (0.25 mg total) by mouth as needed for anxiety. 01/13/14   Dorothy Spark, MD  aspirin EC 81 MG tablet Take 1 tablet (81 mg total) by mouth daily. 10/14/13   Dorothy Spark, MD  Calcium Carbonate-Vitamin D (CALCIUM + D PO) Take by mouth daily.    Historical Provider, MD  metoprolol tartrate (LOPRESSOR)  12.5 mg TABS tablet Take 0.5 tablets (12.5 mg total) by mouth 2 (two) times daily. 01/13/14   Dorothy Spark, MD   BP 113/73  Pulse 70  Temp(Src) 97.6 F (36.4 C) (Oral)  Resp 16  Ht 5\' 4"  (1.626 m)  Wt 122 lb (55.339 kg)  BMI 20.93 kg/m2  SpO2 98% Physical Exam 1410: Physical examination:  Nursing notes reviewed; Vital signs and O2 SAT reviewed;  Constitutional: Well developed, Well nourished, Well hydrated, In no acute  distress; Head:  Normocephalic, atraumatic; Eyes: EOMI, PERRL, No scleral icterus; ENMT: Mouth and pharynx normal, Mucous membranes moist; Neck: Supple, Full range of motion, No lymphadenopathy; Cardiovascular: Regular rate and rhythm, No gallop; Respiratory: Breath sounds clear & equal bilaterally, No wheezes.  Speaking full sentences with ease, Normal respiratory effort/excursion; Chest: Nontender, Movement normal; Abdomen: Soft, Nontender, Nondistended, Normal bowel sounds; Genitourinary: No CVA tenderness; Spine:  No midline CS, TS, LS tenderness.;; Extremities: Pulses normal, No tenderness, No edema, No calf edema or asymmetry. NT left shoulder/elbow/wrist/hand. Motor strength at left shoulder normal.  Sensation intact over deltoid region, distal NMS intact with left hand having intact and equal sensation and strength in the distribution of the median, radial, and ulnar nerve function compared to opposite side.  Strong radial pulse.  +FROM left elbow with intact motor strength biceps and triceps muscles to resistance. No rash. Muscles compartments soft.; Neuro: AA&Ox3, Major CN grossly intact.Speech clear.  No facial droop.  No nystagmus. Grips equal. Strength 5/5 equal bilat UE's and LE's.  DTR 2/4 equal bilat UE's and LE's.  No gross sensory deficits.  Normal cerebellar testing bilat UE's (finger-nose) and LE's (heel-shin)..; Skin: Color normal, Warm, Dry.   ED Course  Procedures     EKG Interpretation   Date/Time:  Friday August 01 2014 14:14:11 EDT Ventricular Rate:  73 PR Interval:  150 QRS Duration: 85 QT Interval:  400 QTC Calculation: 441 R Axis:   -7 Text Interpretation:  Sinus rhythm Low voltage, precordial leads When  compared with ECG of 02/27/2012 No significant change was found Confirmed  by [facility]  MD, Nunzio Cory [phone]) on 08/01/2014 2:36:00 PM      MDM  MDM Reviewed: previous chart, nursing note and vitals Reviewed previous: labs and ECG Interpretation: labs, ECG,  x-ray and CT scan    Results for orders placed during the hospital encounter of 66/06/30  BASIC METABOLIC PANEL      Result Value Ref Range   Sodium 141  137 - 147 mEq/L   Potassium 4.2  3.7 - 5.3 mEq/L   Chloride 101  96 - 112 mEq/L   CO2 28  19 - 32 mEq/L   Glucose, Bld 115 (*) 70 - 99 mg/dL   BUN 20  6 - 23 mg/dL   Creatinine, Ser 0.74  0.50 - 1.10 mg/dL   Calcium 9.8  8.4 - 10.5 mg/dL   GFR calc non Af Amer 89 (*) >90 mL/min   GFR calc Af Amer >90  >90 mL/min   Anion gap 12  5 - 15  CBC WITH DIFFERENTIAL      Result Value Ref Range   WBC 7.3  4.0 - 10.5 K/uL   RBC 4.24  3.87 - 5.11 MIL/uL   Hemoglobin 13.2  12.0 - 15.0 g/dL   HCT 39.2  36.0 - 46.0 %   MCV 92.5  78.0 - 100.0 fL   MCH 31.1  26.0 - 34.0 pg   MCHC 33.7  30.0 -  36.0 g/dL   RDW 13.3  11.5 - 15.5 %   Platelets 226  150 - 400 K/uL   Neutrophils Relative % 75  43 - 77 %   Neutro Abs 5.5  1.7 - 7.7 K/uL   Lymphocytes Relative 18  12 - 46 %   Lymphs Abs 1.3  0.7 - 4.0 K/uL   Monocytes Relative 6  3 - 12 %   Monocytes Absolute 0.4  0.1 - 1.0 K/uL   Eosinophils Relative 1  0 - 5 %   Eosinophils Absolute 0.1  0.0 - 0.7 K/uL   Basophils Relative 0  0 - 1 %   Basophils Absolute 0.0  0.0 - 0.1 K/uL  TROPONIN I      Result Value Ref Range   Troponin I <0.30  <0.30 ng/mL   Dg Chest 2 View 08/01/2014   CLINICAL DATA:  Upper back pain  EXAM: CHEST  2 VIEW  COMPARISON:  02/02/2009  FINDINGS: Heart size is normal.  Vascularity is normal.  COPD with Mild hyperinflation. Apical scarring bilaterally is stable.  Negative for pneumonia or mass lesion. No change from the prior study.  IMPRESSION: COPD.  No acute abnormality.   Electronically Signed   By: Franchot Gallo M.D.   On: 08/01/2014 15:09   Ct Head Wo Contrast 08/01/2014   CLINICAL DATA:  Left arm and hand tingling earlier lasting over 30 min. History of atrial fibrillation.  EXAM: CT HEAD WITHOUT CONTRAST  TECHNIQUE: Contiguous axial images were obtained from the base  of the skull through the vertex without intravenous contrast.  COMPARISON:  None.  FINDINGS: The brainstem, cerebellum, cerebral peduncles, thalamus, basal ganglia, basilar cisterns, and ventricular system appear within normal limits. No intracranial hemorrhage, mass lesion, or acute CVA. Mild chronic ethmoid sinusitis is present.  IMPRESSION: 1. Mild chronic ethmoid sinusitis. Otherwise, no significant abnormalities are observed.   Electronically Signed   By: Sherryl Barters M.D.   On: 08/01/2014 14:51    1530:   Will obtain MRI brain while in the ED to r/o CVA. EKG and troponin reassuring; but cannot fully r/o ACS. Dx and testing d/w pt and family.  Questions answered.  Verb understanding, agreeable to admit.  T/C to [facility]. Conley Canal, case discussed, including:  HPI, pertinent PM/SHx, VS/PE, dx testing, ED course and treatment:  Agreeable to admit, requests to write temporary orders, obtain observation tele bed to team 10.   Francine Graven, DO 08/01/14 2029

## 2014-08-01 NOTE — Consult Note (Signed)
Consult Reason for Consult: LUE paresthesias Referring Physician: Dr Conley Canal  CC: left arm numbness  HPI: Candace Wells is a 63 y.o. female with h/o A fib followed by Dr. Meda Coffee, breast cancer in remission presents with left arm parasthesias distal to the elbow and involving fingers 4 and 5. Right handed. Called her son in law Dr. Noemi Chapel, ED doctor, who examined her and recommended going to ED. Symptoms lasted around 1-2 hours. No CP. Has been having palpitations more frequently than usual. Denies CP, dyspnea. parasthesias now resolved. No similar h/o same. No h/o stroke, TIA or thromboembolus. On ASA 81 mg for previous chadsvasc score 1. Metoprolol for rate control. CT brain shows nothing acute. Has a mild headache now.  Per notes her son reports paresthesias may have involved all fingers and possibly some back pressure too. Patient notes a sensation of muscle spasms and tightness in her upper cervical region during this event. Of note, she works as a Probation officer.    Past Medical History  Diagnosis Date  . Osteoporosis   . Breast cancer 2010    left  . Palpitations   . Atrial fibrillation     on 48 hour Holter monitor    Past Surgical History  Procedure Laterality Date  . Tonsillectomy    . Dilation and curettage of uterus    . Breast surgery  2010    left mastectomy and snbx  . Breast biopsy  02/28/2012    Procedure: BREAST BIOPSY WITH NEEDLE LOCALIZATION;  Surgeon: Rolm Bookbinder, MD;  Location: [facility];  Service: General;  Laterality: Right;  Right breast wire guided biopsy  . Breast reconstruction  04/23/2012    Procedure: BREAST RECONSTRUCTION;  Surgeon: Crissie Reese, MD;  Location: [facility];  Service: Plastics;  Laterality: Bilateral;  bilateral breast reconstruction with Tissue Expander placement    Family History  Problem Relation Age of Onset  . Breast cancer Mother   . Colon cancer Father   . Hypertension Father   .  Hypertension Maternal Grandmother   . Heart disease Maternal Grandfather     Social History:  reports that she has never smoked. She has never used smokeless tobacco. She reports that she does not drink alcohol or use illicit drugs.  Allergies  Allergen Reactions  . Tetracyclines & Related Hives    Medications:  Scheduled: .  stroke: mapping our early stages of recovery book   Does not apply Once  . [START ON 08/02/2014] aspirin  325 mg Oral Daily  . enoxaparin (LOVENOX) injection  40 mg Subcutaneous Q24H  . metoprolol tartrate  12.5 mg Oral BID   Continuous:   ROS: Out of a complete 14 system review, the patient complains of only the following symptoms, and all other reviewed systems are negative.  Physical Examination: Blood pressure 99/64, pulse 61, temperature 97.9 F (36.6 C), temperature source Oral, resp. rate 18, height 5\' 4"  (1.626 m), weight 55.339 kg (122 lb), SpO2 100.00%.  Neurologic Examination Mental Status: Alert, oriented, thought content appropriate.  Speech fluent without evidence of aphasia.  Able to follow 3 step commands without difficulty. Cranial Nerves: II: funduscopic exam wnl bilaterally, visual fields grossly normal, pupils equal, round, reactive to light and accommodation III,IV, VI: ptosis not present, extra-ocular motions intact bilaterally V,VII: smile symmetric, facial light touch sensation normal bilaterally VIII: hearing normal bilaterally IX,X: gag reflex present XI: trapezius strength/neck flexion strength normal bilaterally XII: tongue strength normal  Motor: Right :  Upper extremity    Left:     Upper extremity 5/5 deltoid       5/5 deltoid 5/5 biceps      5/5 biceps  5/5 triceps      5/5 triceps 5/5wrist flexion     5/5 wrist flexion 5/5 wrist extension     5/5 wrist extension 5/5 hand grip      5/5 hand grip  Lower extremity     Lower extremity 5/5 hip flexor      5/5 hip flexor 5/5 quadricep      5/5 quadriceps  5/5  hamstrings     5/5 hamstrings 5/5 plantar flexion       5/5 plantar flexion 5/5 plantar extension     5/5 plantar extension Tone and bulk:normal tone throughout; no atrophy noted Sensory: Pinprick and light touch intact throughout, bilaterally Deep Tendon Reflexes: 2+ and symmetric throughout Plantars: Right: downgoing   Left: downgoing Cerebellar: normal finger-to-nose, normal rapid alternating movements and normal heel-to-shin test Gait: normal gait and station  Laboratory Studies:   Basic Metabolic Panel:  Recent Labs Lab 08/01/14 1407  NA 141  K 4.2  CL 101  CO2 28  GLUCOSE 115*  BUN 20  CREATININE 0.74  CALCIUM 9.8    Liver Function Tests: No results found for this basename: AST, ALT, ALKPHOS, BILITOT, PROT, ALBUMIN,  in the last 168 hours No results found for this basename: LIPASE, AMYLASE,  in the last 168 hours No results found for this basename: AMMONIA,  in the last 168 hours  CBC:  Recent Labs Lab 08/01/14 1407  WBC 7.3  NEUTROABS 5.5  HGB 13.2  HCT 39.2  MCV 92.5  PLT 226    Cardiac Enzymes:  Recent Labs Lab 08/01/14 1407  TROPONINI <0.30    BNP: No components found with this basename: POCBNP,   CBG: No results found for this basename: GLUCAP,  in the last 168 hours  Microbiology: No results found for this or any previous visit.  Coagulation Studies: No results found for this basename: LABPROT, INR,  in the last 72 hours  Urinalysis: No results found for this basename: COLORURINE, APPERANCEUR, LABSPEC, PHURINE, GLUCOSEU, HGBUR, BILIRUBINUR, KETONESUR, PROTEINUR, UROBILINOGEN, NITRITE, LEUKOCYTESUR,  in the last 168 hours  Lipid Panel:  No results found for this basename: chol, trig, hdl, cholhdl, vldl, ldlcalc    HgbA1C:  No results found for this basename: HGBA1C    Urine Drug Screen:   No results found for this basename: labopia, cocainscrnur, labbenz, amphetmu, thcu, labbarb    Alcohol Level: No results found for this  basename: ETH,  in the last 168 hours  Other results: EKG: normal EKG, normal sinus rhythm, unchanged from previous tracings, normal sinus rhythm.  Imaging: Dg Chest 2 View  08/01/2014   CLINICAL DATA:  Upper back pain  EXAM: CHEST  2 VIEW  COMPARISON:  02/02/2009  FINDINGS: Heart size is normal.  Vascularity is normal.  COPD with Mild hyperinflation. Apical scarring bilaterally is stable.  Negative for pneumonia or mass lesion. No change from the prior study.  IMPRESSION: COPD.  No acute abnormality.   Electronically Signed   By: Franchot Gallo M.D.   On: 08/01/2014 15:09   Ct Head Wo Contrast  08/01/2014   CLINICAL DATA:  Left arm and hand tingling earlier lasting over 30 min. History of atrial fibrillation.  EXAM: CT HEAD WITHOUT CONTRAST  TECHNIQUE: Contiguous axial images were obtained from the base of the skull through  the vertex without intravenous contrast.  COMPARISON:  None.  FINDINGS: The brainstem, cerebellum, cerebral peduncles, thalamus, basal ganglia, basilar cisterns, and ventricular system appear within normal limits. No intracranial hemorrhage, mass lesion, or acute CVA. Mild chronic ethmoid sinusitis is present.  IMPRESSION: 1. Mild chronic ethmoid sinusitis. Otherwise, no significant abnormalities are observed.   Electronically Signed   By: Sherryl Barters M.D.   On: 08/01/2014 14:51   Mr Brain Wo Contrast  08/01/2014   CLINICAL DATA:  Left arm paresthesias and headache. Duration of symptoms 1-2 days.  EXAM: MRI HEAD WITHOUT CONTRAST  TECHNIQUE: Multiplanar, multiecho pulse sequences of the brain and surrounding structures were obtained without intravenous contrast.  COMPARISON:  Head CT same day  FINDINGS: Diffusion imaging does not show any acute or subacute infarction. The brainstem and cerebellum are normal. The cerebral hemispheres are normal except for a few punctate foci of T2 and FLAIR signal within the hemispheric white matter, likely subclinical. No cortical or large  vessel territory insult. No mass lesion, hemorrhage, hydrocephalus or extra-axial collection. No pituitary mass. No inflammatory sinus disease.  IMPRESSION: No acute or subacute insult. Normal study with the exception of a very few tiny white matter foci and not likely of clinical relevance.   Electronically Signed   By: Nelson Chimes M.D.   On: 08/01/2014 18:47     Assessment/Plan:  63y/o woman with history of A fib presenting for initial evaluation of transient episode of paresthesias involving the C8 distribution on the left. Differential would include a TIA vs a cervical radiculopathy/peripheral neuropathy. MRI brain shows no acute stroke. Remaining stroke workup pending. Based on clinical description her symptoms appear more consistent with a C8 radiculopathy but cannot rule out TIA. Would complete stroke workup. Will add MRI C spine. If event is not labeled a TIA then CHADS vasc score is 1. Will defer anticoagulation discussion until workup complete.   1)2D echo 2)Carotid doppler 3)Lipid and hemoglobin A1C 4)MRI C spine 5)ASA 325mg  daily 6)Stroke team to follow    Jim Like, DO [facility] [phone]  If 7pm- 7am, please page neurology on call as listed in [place]. 08/01/2014, 7:32 PM

## 2014-08-01 NOTE — ED Notes (Signed)
Pt coming with c/o of left arm numbness that starts in the elbow and radiates to the left finger tips.  Pt states the tingling as subsided since the onset but is still present in her finger tips while in the ED.

## 2014-08-01 NOTE — Progress Notes (Signed)
1630 - pt arrived to unit per bed from ED with [name], [place] ED.  No distress noted. Assessment performed as charted.  Family members at bedside.  Nurse paged Dr. Conley Canal with pt's arrival to unit, per order.  Will closely monitor   Candace Wells I 08/01/2014 4:54 PM

## 2014-08-01 NOTE — Progress Notes (Signed)
1610 - report recd from [name], [place] in ED. Will monitor for pt's arrival to unit   [name] I 08/01/2014 4:53 PM

## 2014-08-01 NOTE — H&P (Signed)
[facility] History and Physical  Candace Wells XBD:[id number] DOB: Sep 23, 1951 DOA: 08/01/2014  Referring physician: Thurnell Garbe PCP:  Melinda Crutch, MD   Chief Complaint: left arm tingling  HPI: Candace Wells is a 63 y.o. female  With h/o PAF followed by Dr. Meda Coffee, breast cancer in remission presents with left arm parasthesias distal to the elbow and involving fingers 4 and 5.  Right handed. Called her son in law Dr. Noemi Chapel, ED doctor, who examined her and recommended going to ED. No CP. Has been having palpitations more frequently than usual. Denies CP, dyspnea. parasthesias now resolved. No similar h/o same. No h/o stroke, TIA or thromboembolus. On ASA 81 mg for previous chadsvasc score 1. Metoprolol for rate control. CT brain shows nothing acute.  Has a mild headache now.  Discussed with Dr. Sabra Heck after I examined the patient. He reports parasthesias may have involved all fingers, and possibly some back pressure as well.  Review of Systems:  systems reviewed. As above otherwise negative.  Past Medical History  Diagnosis Date  . Osteoporosis   . Breast cancer 2010    left  . Palpitations   . Atrial fibrillation     on 48 hour Holter monitor   Past Surgical History  Procedure Laterality Date  . Tonsillectomy    . Dilation and curettage of uterus    . Breast surgery  2010    left mastectomy and snbx  . Breast biopsy  02/28/2012    Procedure: BREAST BIOPSY WITH NEEDLE LOCALIZATION;  Surgeon: Rolm Bookbinder, MD;  Location: [facility];  Service: General;  Laterality: Right;  Right breast wire guided biopsy  . Breast reconstruction  04/23/2012    Procedure: BREAST RECONSTRUCTION;  Surgeon: Crissie Reese, MD;  Location: [facility];  Service: Plastics;  Laterality: Bilateral;  bilateral breast reconstruction with Tissue Expander placement   Social History:  reports that she has never smoked. She has never used smokeless tobacco. She  reports that she does not drink alcohol or use illicit drugs. 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  Allergies  Allergen Reactions  . Tetracyclines & Related Hives    Family History  Problem Relation Age of Onset  . Breast cancer Mother   . Colon cancer Father   . Hypertension Father   . Hypertension Maternal Grandmother   . Heart disease Maternal Grandfather      Prior to Admission medications   Medication Sig Start Date End Date Taking? Authorizing Provider  aspirin EC 81 MG tablet Take 1 tablet (81 mg total) by mouth daily. 10/14/13  Yes Dorothy Spark, MD  Calcium Carbonate-Vitamin D (CALCIUM + D PO) Take 1 tablet by mouth daily.    Yes Historical Provider, MD  metoprolol tartrate (LOPRESSOR) 12.5 mg TABS tablet Take 0.5 tablets (12.5 mg total) by mouth 2 (two) times daily. 01/13/14  Yes Dorothy Spark, MD   Physical Exam: Filed Vitals:   08/01/14 1530 08/01/14 1600 08/01/14 1601 08/01/14 1647  BP: 107/61 102/65 102/65   Pulse: 64 60 62   Temp:      TempSrc:      Resp: 15 15 15    Height:    5\' 4"  (1.626 m)  Weight:    55.339 kg (122 lb)  SpO2: 100% 100% 100%     Wt Readings from Last 3 Encounters:  08/01/14 55.339 kg (122 lb)  03/04/14 64.955 kg (143 lb 3.2 oz)  01/13/14 63.504 kg (140 lb)  BP 102/65  Pulse 62  Temp(Src) 97.6 F (36.4 C) (Oral)  Resp 15  Ht 5\' 4"  (1.626 m)  Wt 55.339 kg (122 lb)  BMI 20.93 kg/m2  SpO2 100%  General Appearance:    Alert, cooperative, no distress, appears stated age  Head:    Normocephalic, without obvious abnormality, atraumatic  Eyes:    PERRL, conjunctiva/corneas clear, EOM's intact,   Nose:   Nares normal, septum midline, mucosa normal, no drainage    or sinus tenderness  Throat:   Lips, mucosa, and tongue normal; teeth and gums normal  Neck:   Supple, symmetrical, trachea midline, no adenopathy;    thyroid:  no enlargement/tenderness/nodules; no carotid   bruit or JVD  Back:     Symmetric, no curvature, ROM  normal, no CVA tenderness  Lungs:     Clear to auscultation bilaterally, respirations unlabored  Chest Wall:    No tenderness or deformity   Heart:    Regular rate and rhythm, S1 and S2 normal, no murmur, rub   or gallop  Abdomen:     Soft, non-tender, bowel sounds active all four quadrants,    no masses, no organomegaly  Genitalia:    deferred  Rectal:    deferred  Extremities:   Extremities normal, atraumatic, no cyanosis or edema  Pulses:   2+ and symmetric all extremities  Skin:   Skin color, texture, turgor normal, no rashes or lesions  Lymph nodes:   Cervical, supraclavicular, and axillary nodes normal  Neurologic:   CNII-XII intact, normal strength, sensation and reflexes    Throughout. Normal finger to nose, no dysdiadokinesis             Psych: normal affect  Labs on Admission:  Basic Metabolic Panel:  Recent Labs Lab 08/01/14 1407  NA 141  K 4.2  CL 101  CO2 28  GLUCOSE 115*  BUN 20  CREATININE 0.74  CALCIUM 9.8   Liver Function Tests: No results found for this basename: AST, ALT, ALKPHOS, BILITOT, PROT, ALBUMIN,  in the last 168 hours No results found for this basename: LIPASE, AMYLASE,  in the last 168 hours No results found for this basename: AMMONIA,  in the last 168 hours CBC:  Recent Labs Lab 08/01/14 1407  WBC 7.3  NEUTROABS 5.5  HGB 13.2  HCT 39.2  MCV 92.5  PLT 226   Cardiac Enzymes:  Recent Labs Lab 08/01/14 1407  TROPONINI <0.30    BNP (last 3 results) No results found for this basename: PROBNP,  in the last 8760 hours CBG: No results found for this basename: GLUCAP,  in the last 168 hours  Radiological Exams on Admission: Dg Chest 2 View  08/01/2014   CLINICAL DATA:  Upper back pain  EXAM: CHEST  2 VIEW  COMPARISON:  02/02/2009  FINDINGS: Heart size is normal.  Vascularity is normal.  COPD with Mild hyperinflation. Apical scarring bilaterally is stable.  Negative for pneumonia or mass lesion. No change from the prior study.   IMPRESSION: COPD.  No acute abnormality.   Electronically Signed   By: Franchot Gallo M.D.   On: 08/01/2014 15:09   Ct Head Wo Contrast  08/01/2014   CLINICAL DATA:  Left arm and hand tingling earlier lasting over 30 min. History of atrial fibrillation.  EXAM: CT HEAD WITHOUT CONTRAST  TECHNIQUE: Contiguous axial images were obtained from the base of the skull through the vertex without intravenous contrast.  COMPARISON:  None.  FINDINGS: The brainstem,  cerebellum, cerebral peduncles, thalamus, basal ganglia, basilar cisterns, and ventricular system appear within normal limits. No intracranial hemorrhage, mass lesion, or acute CVA. Mild chronic ethmoid sinusitis is present.  IMPRESSION: 1. Mild chronic ethmoid sinusitis. Otherwise, no significant abnormalities are observed.   Electronically Signed   By: Sherryl Barters M.D.   On: 08/01/2014 14:51    LPN:PYYFR rhythm Low voltage, precordial leads  Assessment/Plan Principal Problem:   Paresthesia of arm: now resolved. With h/o atrial fib, concerned about TIA. Will get MRI, echo, carotid dopplers, lipids, hgb a1c, neuro checks. Monitor on tele. May need anticoagulation. Have consulted neuro to weigh in. ASA for now Active Problems:   Breast cancer, female, in remission. paroxsymal atrial fibrillation   Code Status: full Family Communication: husband at bedside Disposition Plan: tele. obs  Time spent: 60 min  Delfina Redwood, md [facility] Pager [phone]

## 2014-08-02 ENCOUNTER — Other Ambulatory Visit: Payer: Self-pay | Admitting: Neurology

## 2014-08-02 DIAGNOSIS — R202 Paresthesia of skin: Secondary | ICD-10-CM

## 2014-08-02 DIAGNOSIS — G451 Carotid artery syndrome (hemispheric): Secondary | ICD-10-CM

## 2014-08-02 DIAGNOSIS — I369 Nonrheumatic tricuspid valve disorder, unspecified: Secondary | ICD-10-CM

## 2014-08-02 DIAGNOSIS — G459 Transient cerebral ischemic attack, unspecified: Secondary | ICD-10-CM

## 2014-08-02 DIAGNOSIS — I480 Paroxysmal atrial fibrillation: Secondary | ICD-10-CM

## 2014-08-02 LAB — HEMOGLOBIN A1C
Hgb A1c MFr Bld: 5.7 % — ABNORMAL HIGH (ref <5.7)
Mean Plasma Glucose: 117 mg/dL — ABNORMAL HIGH (ref <117)

## 2014-08-02 LAB — LIPID PANEL
CHOL/HDL RATIO: 4.8 ratio
CHOLESTEROL: 159 mg/dL (ref 0–200)
HDL: 33 mg/dL — ABNORMAL LOW (ref >39)
LDL CALC: 111 mg/dL — AB (ref 0–99)
Triglycerides: 73 mg/dL (ref <150)
VLDL: 15 mg/dL (ref 0–40)

## 2014-08-02 LAB — TROPONIN I: Troponin I: <0.3 ng/mL (ref <0.30)

## 2014-08-02 MED ORDER — RIVAROXABAN 20 MG PO TABS: 20.0000 mg | ORAL_TABLET | Freq: Every day | ORAL | Status: DC

## 2014-08-02 MED ORDER — ATORVASTATIN CALCIUM 10 MG PO TABS: 10.0000 mg | ORAL_TABLET | Freq: Every day | ORAL | Status: DC

## 2014-08-02 NOTE — Progress Notes (Signed)
Echocardiogram 2D Echocardiogram has been performed.  Doyle Askew 08/02/2014, 1:36 PM

## 2014-08-02 NOTE — Progress Notes (Signed)
STROKE TEAM PROGRESS NOTE   HISTORY ILEE RANDLEMAN is a 63 y.o. female with h/o A fib followed by Dr. Meda Coffee, breast cancer in remission presents with left arm parasthesias distal to the elbow and involving fingers 4 and 5. Right handed. Called her son in law Dr. Noemi Chapel, ED doctor, who examined her and recommended going to ED. Symptoms lasted around 1-2 hours. No CP. Has been having palpitations more frequently than usual. Denies CP, dyspnea. parasthesias now resolved. No similar h/o same. No h/o stroke, TIA or thromboembolus. On ASA 81 mg for previous chadsvasc score 1. Metoprolol for rate control. CT brain shows nothing acute. Has a mild headache now. Per notes her son reports paresthesias may have involved all fingers and possibly some back pressure too. Patient notes a sensation of muscle spasms and tightness in her upper cervical region during this event. Of note, she works as a Probation officer.     SUBJECTIVE ([place]) No family members present. The patient's symptoms have resolved. Dr. Erlinda Hong and the patient had a long discussion regarding the pros and cons of anticoagulation. The patient has noted occasional low blood pressures while on beta blocker for rate control.   OBJECTIVE Temp:  [97.5 F (36.4 C)-98.4 F (36.9 C)] 97.5 F (36.4 C) (10/17 0956) Pulse Rate:  [54-58] 58 (10/17 0956) Cardiac Rhythm:  [-] Sinus bradycardia (10/17 0900) Resp:  [16] 16 (10/17 0956) BP: (91-101)/(43-59) 101/59 mmHg (10/17 0956) SpO2:  [99 %-100 %] 100 % (10/17 0956)  No results found for this basename: GLUCAP,  in the last 168 hours  Recent Labs Lab 08/01/14 1407  NA 141  K 4.2  CL 101  CO2 28  GLUCOSE 115*  BUN 20  CREATININE 0.74  CALCIUM 9.8   No results found for this basename: AST, ALT, ALKPHOS, BILITOT, PROT, ALBUMIN,  in the last 168 hours  Recent Labs Lab 08/01/14 1407  WBC 7.3  NEUTROABS 5.5  HGB 13.2  HCT 39.2  MCV 92.5  PLT 226    Recent Labs Lab  08/01/14 1407 08/02/14 0436  TROPONINI <0.30 <0.30   No results found for this basename: LABPROT, INR,  in the last 72 hours No results found for this basename: COLORURINE, APPERANCEUR, LABSPEC, PHURINE, GLUCOSEU, HGBUR, BILIRUBINUR, KETONESUR, PROTEINUR, UROBILINOGEN, NITRITE, LEUKOCYTESUR,  in the last 72 hours     Component Value Date/Time   CHOL 159 08/02/2014 0436   TRIG 73 08/02/2014 0436   HDL 33* 08/02/2014 0436   CHOLHDL 4.8 08/02/2014 0436   VLDL 15 08/02/2014 0436   LDLCALC 111* 08/02/2014 0436   Lab Results  Component Value Date   HGBA1C 5.7* 08/02/2014   No results found for this basename: labopia,  cocainscrnur,  labbenz,  amphetmu,  thcu,  labbarb    No results found for this basename: ETH,  in the last 168 hours  Dg Chest 2 View 08/01/2014    COPD.  No acute abnormality.   Ct Head Wo Contrast 08/01/2014    1. Mild chronic ethmoid sinusitis. Otherwise, no significant abnormalities are observed.     Mr Brain Wo Contrast 08/01/2014    No acute or subacute insult. Normal study with the exception of a very few tiny white matter foci and not likely of clinical relevance.   MRI of the cervical spine  - pending official report, but by my read there is no myelopathy but there is neuro foraminal narrowing.   2D echo - - Left ventricle: The cavity  size was normal. Wall thickness was normal. Systolic function was normal. The estimated ejection fraction was in the range of 60% to 65%. Wall motion was normal; there were no regional wall motion abnormalities. Left ventricular diastolic function parameters were normal. - Aortic valve: Valve area (Vmax): 2.31 cm^2. - Atrial septum: No defect or patent foramen ovale was identified.  Impressions: - No cardiac source of emboli was indentified.  CUS - pending  PHYSICAL EXAM  Temp:  [97.5 F (36.4 C)-98.4 F (36.9 C)] 97.5 F (36.4 C) (10/17 0956) Pulse Rate:  [54-58] 58 (10/17 0956) Resp:  [16] 16 (10/17  0956) BP: (91-101)/(43-59) 101/59 mmHg (10/17 0956) SpO2:  [99 %-100 %] 100 % (10/17 0956)  General - Well nourished, well developed, in no apparent distress.  Ophthalmologic - Sharp disc margins OU.  Cardiovascular - Regular rate and rhythm with no murmur.  Mental Status -  Level of arousal and orientation to time, place, and person were intact. Language including expression, naming, repetition, comprehension, reading, and writing was assessed and found intact. Attention span and concentration were normal. Recent and remote memory were intact. Fund of Knowledge was assessed and was intact.  Cranial Nerves II - XII - II - Visual field intact OU. III, IV, VI - Extraocular movements intact. V - Facial sensation intact bilaterally. VII - Facial movement intact bilaterally. VIII - Hearing & vestibular intact bilaterally. X - Palate elevates symmetrically. XI - Chin turning & shoulder shrug intact bilaterally. XII - Tongue protrusion intact.  Motor Strength - The patient's strength was normal in all extremities and pronator drift was absent.  Bulk was normal and fasciculations were absent.   Motor Tone - Muscle tone was assessed at the neck and appendages and was normal.  Reflexes - The patient's reflexes were normal in all extremities and she had no pathological reflexes.  Sensory - Light touch, temperature/pinprick, vibration and proprioception, and Romberg testing were assessed and were normal.    Coordination - The patient had normal movements in the hands and feet with no ataxia or dysmetria.  Tremor was absent.  Gait and Station - The patient's transfers, posture, gait, station, and turns were observed as normal.   ASSESSMENT/PLAN  Ms. SHERRY BLACKARD is a 63 y.o. female with history of  atrial fibrillation not anticoagulated presenting with transient left upper extremity numbness. She did not receive IV t-PA as her deficits were mild.   Possible TIA vs. C8  radiculopathy - she can not tell exactly the distribution of numbness as it has been resolved. Can not rule out TIA due to Afib. She does have symptomatic palpitation.   MRI  no acute or subacute insult  MRA  not ordered  Carotid Doppler  pending  2D Echo  unremarkable  MRI of the cervical spine - pending report  LDL 111, not at goal of < 70  HgbA1c 5.7  Recommend Xarelto for DVT prophylaxis - as CHADS-VACS score could be 3 now.  aspirin 81 mg orally every day prior to admission, now on xarelto ( rivaroxaban)  Patient counseled to be compliant with her antithrombotic medications  Risk factor education  Ongoing aggressive risk factor management  Resultant resolution of deficits  Therapy recommendations:  Pending  Disposition:  Pending  Afib - paroxysmal afib - not on AC in the past due to low CHADS score  - not able to rule out non-afib event.  - recommend NOAC - Xarelto once a day.  Hypertension  Home meds:  Metoprolol 12.5 mg twice a day  Blood pressure mildly low at times due to metoprolol, recommend to check BP at home and then follow up with PCP  BP goal normotensive  Hyperlipidemia  Home meds:  No lipid lowering medications prior to admission  LDL 111, not at goal of < 70  Add Lipitor 10 mg daily  Continue statin at discharge  Other Pertinent History  Hypotension  Occasional neck pain - MRI of cervical spine pending  Aspirin discontinued Xarelto started  Hospital day # [place] PA-C [facility] Pager [phone] 08/02/2014, 11:26 PM  I, the attending vascular neurologist, have personally obtained a history, examined the patient, evaluated laboratory data, individually viewed imaging studies, and formulated the assessment and plan of care.  I have made any additions or clarifications directly to the above note and agree with the findings and plan as currently documented.    Rosalin Hawking, MD PhD Stroke  Neurology 08/02/2014 11:31 PM   To contact Stroke Continuity provider, please refer to [email or web address] After hours, contact General Neurology

## 2014-08-02 NOTE — Progress Notes (Signed)
Discharge instructions given to patient and her husband. All questions answered. Patient escorted in [facility] by staff to lobby for DC to home by private vehicle with husband.

## 2014-08-02 NOTE — Progress Notes (Signed)
UR completed 

## 2014-08-02 NOTE — Discharge Summary (Signed)
Physician Discharge Summary  Candace Wells NID:[id number] DOB: 15-Feb-1951 DOA: 08/01/2014  PCP:  Melinda Crutch, MD  Admit date: 08/01/2014 Discharge date: 08/02/2014  Time spent: 35 minutes  Recommendations for Outpatient Follow-up:  1. Please follow up on blood pressures and heart rates, during this hospitalization she was hypotensive with SBP's in the 90's, remained in sinus rhythm with HR's in the 50's - 60's. Metoprolol was discontinued on discharge 2. Neurology recommended starting anticoagulation therapy, she was given a script for Xarelto. She would like to discuss this further with her cardiologist this week prior to starting therapy.    Discharge Diagnoses:  Principal Problem:   Paresthesia of arm Active Problems:   Breast cancer, female   Paroxysmal atrial fibrillation   Discharge Condition: Stable  Diet recommendation: Heart Healthy  Filed Weights   08/01/14 1419 08/01/14 1421 08/01/14 1647  Weight: 55.339 kg (122 lb) 55.339 kg (122 lb) 55.339 kg (122 lb)    History of present illness:  Candace Wells is a 63 y.o. female  With h/o PAF followed by Dr. Meda Coffee, breast cancer in remission presents with left arm parasthesias distal to the elbow and involving fingers 4 and 5. Right handed. Called her son in law Dr. Noemi Chapel, ED doctor, who examined her and recommended going to ED. No CP. Has been having palpitations more frequently than usual. Denies CP, dyspnea. parasthesias now resolved. No similar h/o same. No h/o stroke, TIA or thromboembolus. On ASA 81 mg for previous chadsvasc score 1. Metoprolol for rate control. CT brain shows nothing acute. Has a mild headache now.   Hospital Course:  Patient is a pleasant 63 year old female with a past medical history of paroxysmal atrial fibrillation, with 48 hour Holter monitor revealing an episode of A. Fib, had been started on aspirin by her cardiologist. She was admitted to the medicine service on 08/01/2014 presenting  with complaints of left arm numbness and tingling distal to elbow. Initial CT scan of brain showed no acute intracranial findings. She was placed in overnight observation, started on TIA protocol. MRI of brain did not show acute or subacute infarct. Patient was seen and evaluated by neurology who recommended starting anticoagulation therapy given concerns for underlying paroxysmal A. Fib. Patient expressing desire to discuss this issue further with her cardiologist this week prior to starting treatment. She was given a prescription for Xarelto 20 mg PO q daily. During this hospitalization she was found to be hypotensive with systolic blood pressures in the 90s. Heart rates in the 50's - 60's. Metoprolol was discontinued on discharge. She was discharged in stable condition on 08/02/2014 to followup with her cardiologist in the next week.  Consultations:  Neurology  Discharge Exam: Filed Vitals:   08/02/14 0956  BP: 101/59  Pulse: 58  Temp: 97.5 F (36.4 C)  Resp: 16    General: Patient is in no acute distress she is awake alert oriented Cardiovascular: Regular rate and rhythm normal S1-S2 no murmurs rubs or gallop Respiratory: Normal respiratory effort lungs are clear to auscultation Abdomen: Soft nontender nondistended positive Neurological: Patient having 5 of 5 muscle strength to bilateral upper and lower extremities, there is no alteration to sensation, 2+ deep tendon reflexes  Discharge Instructions You were cared for by a hospitalist during your hospital stay. If you have any questions about your discharge medications or the care you received while you were in the hospital after you are discharged, you can call the unit and asked to speak with the  hospitalist on call if the hospitalist that took care of you is not available. Once you are discharged, your primary care physician will handle any further medical issues. Please note that NO REFILLS for any discharge medications will be  authorized once you are discharged, as it is imperative that you return to your primary care physician (or establish a relationship with a primary care physician if you do not have one) for your aftercare needs so that they can reassess your need for medications and monitor your lab values.  Discharge Instructions   Call MD for:  difficulty breathing, headache or visual disturbances    Complete by:  As directed      Call MD for:  extreme fatigue    Complete by:  As directed      Call MD for:  hives    Complete by:  As directed      Call MD for:  persistant dizziness or light-headedness    Complete by:  As directed      Call MD for:  persistant nausea and vomiting    Complete by:  As directed      Call MD for:  redness, tenderness, or signs of infection (pain, swelling, redness, odor or green/yellow discharge around incision site)    Complete by:  As directed      Call MD for:  severe uncontrolled pain    Complete by:  As directed      Call MD for:  temperature >100.4    Complete by:  As directed      Diet - low sodium heart healthy    Complete by:  As directed      Increase activity slowly    Complete by:  As directed           Current Discharge Medication List    START taking these medications   Details  rivaroxaban (XARELTO) 20 MG TABS tablet Take 1 tablet (20 mg total) by mouth daily. Qty: 30 tablet, Refills: 1      CONTINUE these medications which have NOT CHANGED   Details  Calcium Carbonate-Vitamin D (CALCIUM + D PO) Take 1 tablet by mouth daily.       STOP taking these medications     aspirin EC 81 MG tablet      metoprolol tartrate (LOPRESSOR) 12.5 mg TABS tablet        Allergies  Allergen Reactions  . Tetracyclines & Related Hives   Follow-up Information   Follow up with  Melinda Crutch, MD In 1 week.   Specialty:  Family Medicine   Contact information:   [address] [place] [address] [phone]       Follow up with Dorothy Spark, MD  In 1 week.   Specialty:  Cardiology   Contact information:   [place] [id number] [phone]        The results of significant diagnostics from this hospitalization (including imaging, microbiology, ancillary and laboratory) are listed below for reference.    Significant Diagnostic Studies: Dg Chest 2 View  08/01/2014   CLINICAL DATA:  Upper back pain  EXAM: CHEST  2 VIEW  COMPARISON:  02/02/2009  FINDINGS: Heart size is normal.  Vascularity is normal.  COPD with Mild hyperinflation. Apical scarring bilaterally is stable.  Negative for pneumonia or mass lesion. No change from the prior study.  IMPRESSION: COPD.  No acute abnormality.   Electronically Signed   By: Juanda Crumble  Carlis Abbott M.D.   On: 08/01/2014 15:09   Ct Head Wo Contrast  08/01/2014   CLINICAL DATA:  Left arm and hand tingling earlier lasting over 30 min. History of atrial fibrillation.  EXAM: CT HEAD WITHOUT CONTRAST  TECHNIQUE: Contiguous axial images were obtained from the base of the skull through the vertex without intravenous contrast.  COMPARISON:  None.  FINDINGS: The brainstem, cerebellum, cerebral peduncles, thalamus, basal ganglia, basilar cisterns, and ventricular system appear within normal limits. No intracranial hemorrhage, mass lesion, or acute CVA. Mild chronic ethmoid sinusitis is present.  IMPRESSION: 1. Mild chronic ethmoid sinusitis. Otherwise, no significant abnormalities are observed.   Electronically Signed   By: Sherryl Barters M.D.   On: 08/01/2014 14:51   Mr Brain Wo Contrast  08/01/2014   CLINICAL DATA:  Left arm paresthesias and headache. Duration of symptoms 1-2 days.  EXAM: MRI HEAD WITHOUT CONTRAST  TECHNIQUE: Multiplanar, multiecho pulse sequences of the brain and surrounding structures were obtained without intravenous contrast.  COMPARISON:  Head CT same day  FINDINGS: Diffusion imaging does not show any acute or subacute infarction. The brainstem and cerebellum are  normal. The cerebral hemispheres are normal except for a few punctate foci of T2 and FLAIR signal within the hemispheric white matter, likely subclinical. No cortical or large vessel territory insult. No mass lesion, hemorrhage, hydrocephalus or extra-axial collection. No pituitary mass. No inflammatory sinus disease.  IMPRESSION: No acute or subacute insult. Normal study with the exception of a very few tiny white matter foci and not likely of clinical relevance.   Electronically Signed   By: Nelson Chimes M.D.   On: 08/01/2014 18:47    Microbiology: No results found for this or any previous visit (from the past 240 hour(s)).   Labs: Basic Metabolic Panel:  Recent Labs Lab 08/01/14 1407  NA 141  K 4.2  CL 101  CO2 28  GLUCOSE 115*  BUN 20  CREATININE 0.74  CALCIUM 9.8   Liver Function Tests: No results found for this basename: AST, ALT, ALKPHOS, BILITOT, PROT, ALBUMIN,  in the last 168 hours No results found for this basename: LIPASE, AMYLASE,  in the last 168 hours No results found for this basename: AMMONIA,  in the last 168 hours CBC:  Recent Labs Lab 08/01/14 1407  WBC 7.3  NEUTROABS 5.5  HGB 13.2  HCT 39.2  MCV 92.5  PLT 226   Cardiac Enzymes:  Recent Labs Lab 08/01/14 1407 08/02/14 0436  TROPONINI <0.30 <0.30   BNP: BNP (last 3 results) No results found for this basename: PROBNP,  in the last 8760 hours CBG: No results found for this basename: GLUCAP,  in the last 168 hours     Signed:  Kelvin Cellar  [facility] 08/02/2014, 12:16 PM

## 2014-08-04 ENCOUNTER — Other Ambulatory Visit: Payer: Self-pay | Admitting: *Deleted

## 2014-08-06 ENCOUNTER — Ambulatory Visit (INDEPENDENT_AMBULATORY_CARE_PROVIDER_SITE_OTHER): Payer: BC Managed Care – PPO | Admitting: Cardiology

## 2014-08-06 ENCOUNTER — Encounter: Payer: Self-pay | Admitting: Cardiology

## 2014-08-06 VITALS — BP 118/70 | HR 70 | Ht 64.0 in | Wt 121.0 lb

## 2014-08-06 DIAGNOSIS — I480 Paroxysmal atrial fibrillation: Secondary | ICD-10-CM

## 2014-08-06 NOTE — Patient Instructions (Signed)
Your physician recommends that you continue on your current medications as directed. Please refer to the Current Medication list given to you today.   You have been referred to Dr Rayann Heman EP doctor at our office for atrial fibrillation and possible ablation. Dr Meda Coffee would like this appointment to be set up soon with Dr Rayann Heman

## 2014-08-06 NOTE — Progress Notes (Signed)
Patient ID: ALPHONSINE MINIUM, female   DOB: 23-Jun-1951, 63 y.o.   MRN: [id number]    Patient Name: Candace Wells Date of Encounter: 08/06/2014  Primary Care Provider:   Melinda Crutch, MD Primary Cardiologist:  Dorothy Spark  Problem List   Past Medical History  Diagnosis Date  . Osteoporosis   . Breast cancer 2010    left  . Palpitations   . Atrial fibrillation     on 48 hour Holter monitor   Past Surgical History  Procedure Laterality Date  . Tonsillectomy    . Dilation and curettage of uterus    . Breast surgery  2010    left mastectomy and snbx  . Breast biopsy  02/28/2012    Procedure: BREAST BIOPSY WITH NEEDLE LOCALIZATION;  Surgeon: Rolm Bookbinder, MD;  Location: [facility];  Service: General;  Laterality: Right;  Right breast wire guided biopsy  . Breast reconstruction  04/23/2012    Procedure: BREAST RECONSTRUCTION;  Surgeon: Crissie Reese, MD;  Location: [facility];  Service: Plastics;  Laterality: Bilateral;  bilateral breast reconstruction with Tissue Expander placement    Allergies  Allergies  Allergen Reactions  . Codeine Nausea Only    Other reaction(s): Vomiting  . Sulfa Antibiotics Hives  . Tetracycline Hives    Patient thinks this is the name of the medication  . Tetracyclines & Related Hives  . Amitriptyline Palpitations    And racing heart    HPI  63 year old female with h/o invasive ductal carcinoma,treated adjuvantly with 4 cycles of dose dense doxorubicin/ cyclophosphamide completed June 2010. She is also status post B/L mastectomy in July 2013 for a high-grade ductal carcinoma in situ with reconstruction surgery revision.  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 The patient states that she has noticed lately that her heart increases abruptly after only few minutes of exercise and she becomes SOB. She also noticed palpitations when she  lays in bed. They last few seconds to minutes. No associated chest pain, dizziness. No h/o syncope. No PND or orthopnea.   01/13/3014 - Follow up after 2 months. She decided not to use Flecainide for the side effect profile, but is using Metoprolol 6.25 mg po BID with significant improvement of her palpitations.  She only has minimal palpitations at night that are resolved with xanax.  08/06/2014 - the patient is coming after hospitalization for possible TIA. She resented to the ER on 10/16 with left arm tingling and paresthesias that resolved within 24 hours. Since I saw her the last time she has taking metoprolol on and off as her BP would get very low and was associated with dizziness. She has been experiencing more frequent and longer lasting episodes of palpitations with HR up to 170.    Home Medications  Prior to Admission medications   Medication Sig Start Date End Date Taking? Authorizing Provider  ALPRAZolam Duanne Moron) 0.25 MG tablet Take 0.25 mg by mouth as needed for anxiety. 07/31/12  Yes Amy Milda Smart, PA-C  Calcium Carbonate-Vitamin D (CALCIUM + D PO) Take by mouth daily.   Yes Historical Provider, MD    Family History  Family History  Problem Relation Age of Onset  . Breast cancer Mother   . Colon cancer Father   . Hypertension Father   . Hypertension Maternal Grandmother   . Heart disease Maternal Grandfather     Social  History  History   Social History  . Marital Status: Married    Spouse Name: N/A    Number of Children: N/A  . Years of Education: N/A   Occupational History  . Not on file.   Social History Main Topics  . Smoking status: Never Smoker   . Smokeless tobacco: Never Used  . Alcohol Use: No  . Drug Use: No  . Sexual Activity: Not on file   Other Topics Concern  . Not on file   Social History Narrative  . No narrative on file     Review of Systems, as per HPI, otherwise negative General:  No chills, fever, night sweats or weight changes.    Cardiovascular:  No chest pain, dyspnea on exertion, edema, orthopnea, palpitations, paroxysmal nocturnal dyspnea. Dermatological: No rash, lesions/masses Respiratory: No cough, dyspnea Urologic: No hematuria, dysuria Abdominal:   No nausea, vomiting, diarrhea, bright red blood per rectum, melena, or hematemesis Neurologic:  No visual changes, wkns, changes in mental status. All other systems reviewed and are otherwise negative except as noted above.  Physical Exam  There were no vitals taken for this visit.  General: Pleasant, NAD Psych: Normal affect. Neuro: Alert and oriented X 3. Moves all extremities spontaneously. HEENT: Normal  Neck: Supple without bruits or JVD. Lungs:  Resp regular and unlabored, CTA. Heart: RRR no s3, s4, or murmurs. Abdomen: Soft, non-tender, non-distended, BS + x 4.  Extremities: No clubbing, cyanosis or edema. DP/PT/Radials 2+ and equal bilaterally.  Labs:  No results found for this basename: CKTOTAL, CKMB, TROPONINI,  in the last 72 hours Lab Results  Component Value Date   WBC 7.3 08/01/2014   HGB 13.2 08/01/2014   HCT 39.2 08/01/2014   MCV 92.5 08/01/2014   PLT 226 08/01/2014     Recent Labs Lab 08/01/14 1407  NA 141  K 4.2  CL 101  CO2 28  BUN 20  CREATININE 0.74  CALCIUM 9.8  GLUCOSE 115*   Lab Results  Component Value Date   CHOL 159 08/02/2014   Accessory Clinical Findings  ECG - NSR, 83 BPM, normal ECG   Echocardiogram 10/17//2015  Left ventricle: The cavity size was normal. Wall thickness was normal. Systolic function was normal. The estimated ejection fraction was in the range of 60% to 65%. Wall motion was normal; there were no regional wall motion abnormalities. The transmitral flow pattern was normal. The deceleration time of the early transmitral flow velocity was normal. The pulmonary vein flow pattern was normal. The tissue Doppler parameters were normal. Left ventricular diastolic function parameters were  normal.  ------------------------------------------------------------------- Aortic valve: Structurally normal valve. Trileaflet. Cusp separation was normal. Doppler: Transvalvular velocity was within the normal range. There was no stenosis. There was no regurgitation. Valve area (Vmax): 2.31 cm^2. Indexed valve area (Vmax): 1.46 cm^2/m^2. Peak gradient (S): 3 mm Hg.  ------------------------------------------------------------------- Aorta: The aorta was normal, not dilated, and non-diseased.  ------------------------------------------------------------------- Mitral valve: Structurally normal valve. Leaflet separation was normal. Doppler: Transvalvular velocity was within the normal range. There was no evidence for stenosis. There was no regurgitation.  ------------------------------------------------------------------- Left atrium: The atrium was normal in size.  ------------------------------------------------------------------- Atrial septum: No defect or patent foramen ovale was identified.  ------------------------------------------------------------------- Right ventricle: The cavity size was normal. Wall thickness was normal. Systolic function was normal.  ------------------------------------------------------------------- Pulmonic valve: Structurally normal valve. Cusp separation was normal. Doppler: Transvalvular velocity was within the normal range. There was no regurgitation.  ------------------------------------------------------------------- Tricuspid valve: Structurally normal valve.  Leaflet separation was normal. Doppler: Transvalvular velocity was within the normal range. There was mild regurgitation.  ------------------------------------------------------------------- Right atrium: The atrium was normal in size.  ------------------------------------------------------------------- Pericardium: There was no pericardial  effusion.  ------------------------------------------------------------------- Systemic veins: Inferior vena cava: The vessel was normal in size. The respirophasic diameter changes were in the normal range (= 50%), consistent with normal central venous pressure.  ------------------------------------------------------------------- Post procedure conclusions Ascending Aorta:  - The aorta was normal, not dilated, and non-diseased.   Exercise treadmill stress test 10/07/13  Reached 85% MPHR (min:sec): 4:02  Total Exercise Time (min-sec): 7:00   Workload in METS: 8.5  Borg Scale: 15   Reason ETT Terminated: desired heart rate attained    ST Segment Analysis  At Rest: non-specific ST segment slurring  With Exercise: non-specific ST changes  Other Information  Arrhythmia: No Angina during ETT: absent (0)  Quality of ETT: diagnostic  ETT Interpretation: normal - no evidence of ischemia by ST analysis  Comments:  Good exercise capacity.  No chest pain.  Normal BP response to exercise.  There was up-sloping ST depression. No significant ST changes to suggest ischemia.    Assessment & Plan  64 year old female with h/o breast cancer treated adjuvantly with 4 cycles of dose dense doxorubicin/ cyclophosphamide completed in 2010. Total dose of doxorubicin unknown.  1. Palpitations - 48-Hour Holter monitor showed a short episode of atrial fibrillation, she was not tolerating metoprolol with hypotension but felt improved symptoms. She has now been experiencing more frequent and longer episodes of palpitations and had an episode of presumed TIA. Her recent echocardiogram is normal, we would schedule her to see Dr Rayann Heman for the evaluation for RF ablation.  We originally placed her on ASA as her CHADS-VASc is 1, however with TIA we will start Xarelto 20 mg po daily. Metoprolol 12.5 mg po PRN with palpitations.  2. DOE - normal LVEF, good exercise capacity, normal exercise stress test, no  evidence of ischemia. Her DOE is not related to her cardiovascular system.   3. Lipids - mildly elevated LDL, diet for now.  Follow up in 6 months.   Dorothy Spark, MD, [facility] 08/06/2014, 11:25 AM

## 2014-08-13 ENCOUNTER — Encounter: Payer: Self-pay | Admitting: Internal Medicine

## 2014-08-13 ENCOUNTER — Ambulatory Visit (INDEPENDENT_AMBULATORY_CARE_PROVIDER_SITE_OTHER): Payer: BC Managed Care – PPO | Admitting: Internal Medicine

## 2014-08-13 VITALS — BP 106/70 | HR 84 | Ht 64.5 in | Wt 120.4 lb

## 2014-08-13 DIAGNOSIS — I480 Paroxysmal atrial fibrillation: Secondary | ICD-10-CM

## 2014-08-13 DIAGNOSIS — R002 Palpitations: Secondary | ICD-10-CM

## 2014-08-13 NOTE — Patient Instructions (Addendum)
Your physician recommends that you schedule a follow-up appointment in: 6-8 weeks with Dr Rayann Heman  Your physician has recommended that you wear an event monitor. Event monitors are medical devices that record the heart's electrical activity. Doctors most often [place] these monitors to diagnose arrhythmias. Arrhythmias are problems with the speed or rhythm of the heartbeat. The monitor is a small, portable device. You can wear one while you do your normal daily activities. This is usually used to diagnose what is causing palpitations/syncope (passing out).

## 2014-08-13 NOTE — Progress Notes (Signed)
Primary Care Physician:  Melinda Crutch, MD Primary Cardiologist:Dr. Meda Coffee Primary Electrophysiologist:Dr. Eldrige Pitkin Referring Physician:Dr. Deyja Sochacki is a 63 y.o. female with a h/o palpitations with one previous monitor (11/14) showing 13 beats of afib who presents for consultation in the [facility], following a recent hospitalization for TIA. She had sensation of L arm tingling that cleared within 24 hours.Now on xarelto with previoius CHA2DS2VASc of 1. Pt with h/o  breast cancer  treated adjuvantly with 4 cycles of dose dense doxorubicin/ cyclophosphamide completed in 2010. Total dose of doxorubicin unknown. She gives a feeling of heart racing nightly. NSR on EKG in hospital, no recent afib documented.Was using daily metoprolol but this was stopped in hospital due to hypotension. She was prescribed flecainide 100 mg bid, one year ago but did not start due to fear of side effects.  Today, she denies symptoms of palpitations, chest pain, shortness of breath, orthopnea, PND, lower extremity edema, dizziness, presyncope, syncope, snoring, daytime somnolence, bleeding, or neurologic sequela. The patient is tolerating medications without difficulties and is otherwise without complaint today.    Atrial Fibrillation Risk Factors:  she does not have symptoms or diagnosis of sleep apnea.  she does not have a history of rheumatic fever.  she does not have a history of alcohol use.  she has a BMI of Body mass index is 20.35 kg/(m^2).Marland Kitchen Filed Weights   08/13/14 0812  Weight: 120 lb 6.4 oz (54.613 kg)    LA size: 30 mm   Atrial Fibrillation Management history:  Previous antiarrhythmic drugs: none  Previous cardioversions: *none  Previous ablations: *none  CHADS2VASC score: 3 (female, TIA)  Anticoagulation history: Xarelto   Past Medical History  Diagnosis Date  . Osteoporosis   . Breast cancer 2010    left  . Palpitations   . Atrial  fibrillation     on 48 hour Holter monitor   Past Surgical History  Procedure Laterality Date  . Tonsillectomy    . Dilation and curettage of uterus    . Breast surgery  2010    left mastectomy and snbx  . Breast biopsy  02/28/2012    Procedure: BREAST BIOPSY WITH NEEDLE LOCALIZATION;  Surgeon: Rolm Bookbinder, MD;  Location: [facility];  Service: General;  Laterality: Right;  Right breast wire guided biopsy  . Breast reconstruction  04/23/2012    Procedure: BREAST RECONSTRUCTION;  Surgeon: Crissie Reese, MD;  Location: [facility];  Service: Plastics;  Laterality: Bilateral;  bilateral breast reconstruction with Tissue Expander placement    Current Outpatient Prescriptions  Medication Sig Dispense Refill  . Calcium Carbonate-Vitamin D (CALCIUM + D PO) Take 1 tablet by mouth daily.       . metoprolol tartrate (LOPRESSOR) 25 MG tablet Take 12.5 mg by mouth daily as needed (palpitations).       . rivaroxaban (XARELTO) 20 MG TABS tablet Take 1 tablet (20 mg total) by mouth daily.  30 tablet  1   No current facility-administered medications for this visit.    Allergies  Allergen Reactions  . Amitriptyline Palpitations and Other (See Comments)    And racing heart  . Sulfa Antibiotics Hives  . Tetracycline Hives    Patient thinks this is the name of the medication  . Tetracyclines & Related Hives  . Codeine Nausea And Vomiting    History   Social History  . Marital Status: Married    Spouse Name: N/A  Number of Children: N/A  . Years of Education: N/A   Occupational History  . Not on file.   Social History Main Topics  . Smoking status: Never Smoker   . Smokeless tobacco: Never Used  . Alcohol Use: No  . Drug Use: No  . Sexual Activity: Not on file   Other Topics Concern  . Not on file   Social History Narrative  . No narrative on file    Family History  Problem Relation Age of Onset  . Breast cancer Mother   . Colon cancer Father    . Hypertension Father   . Hypertension Maternal Grandmother   . Heart disease Maternal Grandfather    The patient does not have a history of early familial atrial fibrillation or other arrhythmias.  ROS- All systems are reviewed and negative except as per the HPI above.  Physical Exam: Filed Vitals:   08/13/14 0812  BP: 106/70  Pulse: 84  Height: 5' 4.5" (1.638 m)  Weight: 120 lb 6.4 oz (54.613 kg)    GEN- The patient is well appearing, alert and oriented x 3 today.   Head- normocephalic, atraumatic Eyes-  Sclera clear, conjunctiva pink Ears- hearing intact Oropharynx- clear Neck- supple, no JVP Lymph- no cervical lymphadenopathy Lungs- Clear to ausculation bilaterally, normal work of breathing Heart- Regular rate and rhythm, no murmurs, rubs or gallops, PMI not laterally displaced GI- soft, NT, ND, + BS Extremities- no clubbing, cyanosis, or edema MS- no significant deformity or atrophy Skin- no rash or lesion Psych- euthymic mood, full affect Neuro- strength and sensation are intact  EKG NSR with non specific ST abnormality. Qtc 456 ms.  Echo- Left ventricle: The cavity size was normal. Wall thickness was normal. Systolic function was normal. The estimated ejection fraction was in the range of 60% to 65%. Wall motion was normal; there were no regional wall motion abnormalities. Left ventricular diastolic function parameters were normal. - Aortic valve: Valve area (Vmax): 2.31 cm^2. - Atrial septum: No defect or patent foramen ovale was identified.  Impressions:  - No cardiac source of emboli was indentified  Epic records are reviewed at length today  Assessment and Plan:  1. Atrial fibrillation The patient has Symptomatic paroxysmal/ persistent atrial fibrillation.  The patients CHAD2VASC score is 3..  she is  appropriately anticoagulated at this time. The patient is adequately rate controlled with metoprolol as needed.. Antiarrhythmic therapy to dates has  included none.  The patients left atrial size is 30 mm.  A long discussion with the patient was had today regarding therapeutic strategies.  Extensive discussion of lifestyle modification was also discussed.  Presently, our recommendations include   1. 30 day event monitor to further understand arrythmia as related to symptoms.. Use metoprolol as needed for palpitations.  2. Recent TIA/CHA2DS2VASc of 3 Continue xarelto   3. F/u with in one month.Roderic Palau NP  Nurse Practitioner, [facility] 08/14/2014 8:52 AM   I have seen, examined the patient, and reviewed the above assessment and plan.  Changes to above are made where necessary.  The patient's symptoms are somewhat atypical for atrial fibrillation.  She describes predominantly  "pounding" or "forceful" heart beat.  The only arrhythmia that we have documented is 13 seconds of afib.  I think that we need to better characterize her arrhythmia before making any changes.  I will therefore  Place 30 day event monitor and have her return for follow-up to  discuss results in 6 weeks  Co Sign: Thompson Grayer, MD 08/14/2014 8:53 AM

## 2014-08-19 ENCOUNTER — Encounter (INDEPENDENT_AMBULATORY_CARE_PROVIDER_SITE_OTHER): Payer: BC Managed Care – PPO

## 2014-08-19 ENCOUNTER — Encounter: Payer: Self-pay | Admitting: *Deleted

## 2014-08-19 DIAGNOSIS — I480 Paroxysmal atrial fibrillation: Secondary | ICD-10-CM

## 2014-08-19 DIAGNOSIS — R002 Palpitations: Secondary | ICD-10-CM

## 2014-08-19 NOTE — Progress Notes (Signed)
Patient ID: Candace Wells, female   DOB: March 20, 1951, 63 y.o.   MRN: [id number] [employer] 30 day cardiac event monitor applied to patient.

## 2014-09-04 ENCOUNTER — Telehealth: Payer: Self-pay | Admitting: Internal Medicine

## 2014-09-04 NOTE — Telephone Encounter (Signed)
ERROR

## 2014-09-12 ENCOUNTER — Telehealth: Payer: Self-pay | Admitting: Internal Medicine

## 2014-09-12 NOTE — Telephone Encounter (Signed)
New Message  Pt called states that it is about time to take the monitor off.. She says she wasn't given enough batteries so she went and bought her own batteries now the device will not sync with the telephone. Pt requests to take it off completley and send it back. Please call back to discuss.Marland Kitchen

## 2014-09-15 NOTE — Telephone Encounter (Signed)
Re-Routing this message to the [employer].. Informed this is not for the device department

## 2014-09-16 ENCOUNTER — Telehealth: Payer: Self-pay | Admitting: Internal Medicine

## 2014-09-16 ENCOUNTER — Ambulatory Visit (INDEPENDENT_AMBULATORY_CARE_PROVIDER_SITE_OTHER): Payer: BC Managed Care – PPO | Admitting: Neurology

## 2014-09-16 ENCOUNTER — Encounter: Payer: Self-pay | Admitting: Neurology

## 2014-09-16 VITALS — BP 101/69 | HR 70 | Ht 64.5 in | Wt 120.0 lb

## 2014-09-16 DIAGNOSIS — I480 Paroxysmal atrial fibrillation: Secondary | ICD-10-CM

## 2014-09-16 DIAGNOSIS — G451 Carotid artery syndrome (hemispheric): Secondary | ICD-10-CM

## 2014-09-16 DIAGNOSIS — E785 Hyperlipidemia, unspecified: Secondary | ICD-10-CM

## 2014-09-16 NOTE — Patient Instructions (Signed)
-   continue Xarelto for stroke prevention - your LDL 111 but you prefer to try diet control first and then consider statin if not able to control the LDL - follow up with Dr. Rayann Heman and Meda Coffee for symptomatic afib - Follow up with your primary care physician for stroke risk factor modification. Recommend maintain blood pressure goal <130/80, diabetes with hemoglobin A1c goal below 6.5% and lipids with LDL cholesterol goal below 70 mg/dL.  - check BP at home - will do carotid doppler and transcranial doppler to complete the stroke work up. - follow up in 2 months.

## 2014-09-16 NOTE — Progress Notes (Signed)
STROKE NEUROLOGY FOLLOW UP NOTE  NAME: Candace Wells DOB: [birth year]  REASON FOR VISIT: stroke follow up HISTORY FROM: pt and chart  Today we had the pleasure of seeing Candace Wells in follow-up at our Neurology Clinic. Pt was accompanied by no one.   History Summary Candace Wells is a 63 y.o. female with h/o A fib followed by Dr. Meda Coffee not [facility], breast cancer in remission was admitted on 08/01/14 for left arm paresthesia distal to the elbow and involving fingers 4 and 5. Symptoms lasted around 1-2 hours. Has been having palpitations more frequently than usual. Denies CP, h/o stroke or TIA. On ASA 81 mg for previous chadsvasc score 1, and metoprolol for rate control. CT brain shows no acute abnormalities. MRI showed no acute infarct. Due to the pattern of paresthesia, concerning for C8 radiculopathy. MRI C-spine negative. TIA was not able to ruled out due to hx of afib and LUE paresthesia, she was put on Xarelto and lipitor for stroke prevention.  Interval History During the interval time, the patient has been doing well. No recurrent stroke like symptoms, but she continued to have palpitations. She followed up with Dr. Meda Coffee and continued Alen Blew, and cut down metoprolol due to low BP, as well as diet control for high LDL. She was also referred to Dr. Rayann Heman for potential AV ablation, but Dr. Rayann Heman wanted 30 day cardiac monitoring. She did not tolerate the monitoring well so the monitoring was discontinued prematurely. Currently she has no complains and her medication only include Xarelto daily and metoprolol PRN for palpitation.     REVIEW OF SYSTEMS: Full 14 system review of systems performed and notable only for those listed below and in HPI above, all others are negative:  Constitutional: N/A  Cardiovascular: palpitations  Ear/Nose/Throat: N/A  Skin: N/A  Eyes: N/A  Respiratory: SOB, chest tightness  Gastroitestinal: N/A  Genitourinary: urinary  frequency Hematology/Lymphatic: N/A  Endocrine: N/A  Musculoskeletal: N/A  Allergy/Immunology: N/A  Neurological: dizziness, HA  Psychiatric: N/A  The following represents the patient's updated allergies and side effects list: Allergies  Allergen Reactions  . Amitriptyline Palpitations and Other (See Comments)    And racing heart  . Sulfa Antibiotics Hives  . Tetracycline Hives    Patient thinks this is the name of the medication  . Tetracyclines & Related Hives  . Codeine Nausea And Vomiting    Labs since last visit of relevance include the following: Results for orders placed or performed during the hospital encounter of 34/19/37  Basic metabolic panel  Result Value Ref Range   Sodium 141 137 - 147 mEq/L   Potassium 4.2 3.7 - 5.3 mEq/L   Chloride 101 96 - 112 mEq/L   CO2 28 19 - 32 mEq/L   Glucose, Bld 115 (H) 70 - 99 mg/dL   BUN 20 6 - 23 mg/dL   Creatinine, Ser 0.74 0.50 - 1.10 mg/dL   Calcium 9.8 8.4 - 10.5 mg/dL   GFR calc non Af Amer 89 (L) >90 mL/min   GFR calc Af Amer >90 >90 mL/min   Anion gap 12 5 - 15  CBC with Differential  Result Value Ref Range   WBC 7.3 4.0 - 10.5 K/uL   RBC 4.24 3.87 - 5.11 MIL/uL   Hemoglobin 13.2 12.0 - 15.0 g/dL   HCT 39.2 36.0 - 46.0 %   MCV 92.5 78.0 - 100.0 fL   MCH 31.1 26.0 - 34.0 pg   MCHC 33.7 30.0 -  36.0 g/dL   RDW 13.3 11.5 - 15.5 %   Platelets 226 150 - 400 K/uL   Neutrophils Relative % 75 43 - 77 %   Neutro Abs 5.5 1.7 - 7.7 K/uL   Lymphocytes Relative 18 12 - 46 %   Lymphs Abs 1.3 0.7 - 4.0 K/uL   Monocytes Relative 6 3 - 12 %   Monocytes Absolute 0.4 0.1 - 1.0 K/uL   Eosinophils Relative 1 0 - 5 %   Eosinophils Absolute 0.1 0.0 - 0.7 K/uL   Basophils Relative 0 0 - 1 %   Basophils Absolute 0.0 0.0 - 0.1 K/uL  Troponin I  Result Value Ref Range   Troponin I <0.30 <0.30 ng/mL  Hemoglobin A1c  Result Value Ref Range   Hgb A1c MFr Bld 5.7 (H) <5.7 %   Mean Plasma Glucose 117 (H) <117 mg/dL  Troponin I   Result Value Ref Range   Troponin I <0.30 <0.30 ng/mL  Lipid panel  Result Value Ref Range   Cholesterol 159 0 - 200 mg/dL   Triglycerides 73 <150 mg/dL   HDL 33 (L) >39 mg/dL   Total CHOL/HDL Ratio 4.8 RATIO   VLDL 15 0 - 40 mg/dL   LDL Cholesterol 111 (H) 0 - 99 mg/dL    The neurologically relevant items on the patient's problem list were reviewed on today's visit.  Neurologic Examination  A problem focused neurological exam (12 or more points of the single system neurologic examination, vital signs counts as 1 point, cranial nerves count for 8 points) was performed.  Blood pressure 101/69, pulse 70, height 5' 4.5" (1.638 m), weight 120 lb (54.432 kg).  General - Well nourished, well developed, in no apparent distress.  Ophthalmologic - Sharp disc margins OU.  Cardiovascular - Regular rate and rhythm with no murmur.  Mental Status -  Level of arousal and orientation to time, place, and person were intact. Language including expression, naming, repetition, comprehension, reading, and writing was assessed and found intact. Attention span and concentration were normal. Recent and remote memory were intact. Fund of Knowledge was assessed and was intact.  Cranial Nerves II - XII - II - Visual field intact OU. III, IV, VI - Extraocular movements intact. V - Facial sensation intact bilaterally. VII - Facial movement intact bilaterally. VIII - Hearing & vestibular intact bilaterally. X - Palate elevates symmetrically. XI - Chin turning & shoulder shrug intact bilaterally. XII - Tongue protrusion intact.  Motor Strength - The patient's strength was normal in all extremities and pronator drift was absent.  Bulk was normal and fasciculations were absent.   Motor Tone - Muscle tone was assessed at the neck and appendages and was normal.  Reflexes - The patient's reflexes were normal in all extremities and she had no pathological reflexes.  Sensory - Light touch,  temperature/pinprick, vibration and proprioception, and Romberg testing were assessed and were normal.    Coordination - The patient had normal movements in the hands and feet with no ataxia or dysmetria.  Tremor was absent.  Gait and Station - The patient's transfers, posture, gait, station, and turns were observed as normal.  Data reviewed: I personally reviewed the images and agree with the radiology interpretations.  Dg Chest 2 View 08/01/2014  COPD. No acute abnormality.   Ct Head Wo Contrast 08/01/2014  1. Mild chronic ethmoid sinusitis. Otherwise, no significant abnormalities are observed.   Mr Brain Wo Contrast 08/01/2014  No acute or subacute insult. Normal  study with the exception of a very few tiny white matter foci and not likely of clinical relevance.   MRI of the cervical spine - 1. Mild cervical spondylosis as described. 2. No disc herniation, significant spinal stenosis or nerve root encroachment identified.   2D echo - - Left ventricle: The cavity size was normal. Wall thickness was normal. Systolic function was normal. The estimated ejection fraction was in the range of 60% to 65%. Wall motion was normal; there were no regional wall motion abnormalities. Left ventricular diastolic function parameters were normal. - Aortic valve: Valve area (Vmax): 2.31 cm^2. - Atrial septum: No defect or patent foramen ovale was identified. Impressions: - No cardiac source of emboli was indentified.  CUS - pending  Component     Latest Ref Rng 08/27/2013 08/02/2014  Cholesterol     0 - 200 mg/dL  159  Triglycerides     <150 mg/dL  73  HDL     >39 mg/dL  33 (L)  Total CHOL/HDL Ratio       4.8  VLDL     0 - 40 mg/dL  15  LDL (calc)     0 - 99 mg/dL  111 (H)  Hgb A1c MFr Bld     <5.7 %  5.7 (H)  Mean Plasma Glucose     <117 mg/dL  117 (H)  TSH     0.35 - 5.50 uIU/mL 1.43     Assessment: As you may recall, she is a 63 y.o. Caucasian female with  PMH of afib not on ASA, breast cancer in remission was admitted on 08/01/14 for transient left UE numbness. The pattern of numbness not very clear as it has resolved but concerning for TIA vs. C8 radiculopathy. MRI no stroke and MRI C-spine minimal changes. For stroke prevention, she was put on Xarelto. Currently, she is following with cardiology for symptomatic afib and potential candidate for AV ablation. She is on diet control for HLD. However, her stroke work up not completed yet as she does not have vessel images. Will do CUS and TCD.  Plan:  - continue Xarelto for stroke prevention - diet control for HLD as preferred by pt - check BP at home - follow up with cardiology for symptomatic Afib - will do CUS and TCD to complete stroke w/u - Follow up with your primary care physician for stroke risk factor modification. Recommend maintain blood pressure goal <130/80, diabetes with hemoglobin A1c goal below 6.5% and lipids with LDL cholesterol goal below 70 mg/dL.  - RTC in 2 months.  Orders Placed This Encounter  Procedures  . US Carotid Bilateral    Standing Status: Future     Number of Occurrences:      Standing Expiration Date: 11/18/2015    Scheduling Instructions:     Please schedule within this month for insurance purpose. thanks    Order Specific Question:  Reason for Exam (SYMPTOM  OR DIAGNOSIS REQUIRED)    Answer:  TIA    Order Specific Question:  Preferred imaging location?    Answer:  Internal  . [place] TCD COMPLETE    Standing Status: Future     Number of Occurrences:      Standing Expiration Date: 11/18/2015    Scheduling Instructions:     Please schedule within this month for insurance purpose. thanks    Order Specific Question:  Reason for Exam (SYMPTOM  OR DIAGNOSIS REQUIRED)    Answer:  TIA    Order  Specific Question:  Preferred imaging location?    Answer:  Internal    No orders of the defined types were placed in this encounter.    Patient Instructions  - continue  Xarelto for stroke prevention - your LDL 111 but you prefer to try diet control first and then consider statin if not able to control the LDL - follow up with Dr. Rayann Heman and Meda Coffee for symptomatic afib - Follow up with your primary care physician for stroke risk factor modification. Recommend maintain blood pressure goal <130/80, diabetes with hemoglobin A1c goal below 6.5% and lipids with LDL cholesterol goal below 70 mg/dL.  - check BP at home - will do carotid doppler and transcranial doppler to complete the stroke work up. - follow up in 2 months.   Rosalin Hawking, MD PhD [facility] [address], [place], [place] [address] [phone]

## 2014-09-16 NOTE — Telephone Encounter (Signed)
New message      Pt is on xarelto.  Can she take fish oil?

## 2014-09-17 NOTE — Telephone Encounter (Signed)
Discussed with Elberta Leatherwood, Pharm D.  Okay to take.  Patient aware.

## 2014-09-18 ENCOUNTER — Ambulatory Visit (INDEPENDENT_AMBULATORY_CARE_PROVIDER_SITE_OTHER): Payer: BC Managed Care – PPO

## 2014-09-18 DIAGNOSIS — G451 Carotid artery syndrome (hemispheric): Secondary | ICD-10-CM

## 2014-09-18 DIAGNOSIS — Z0289 Encounter for other administrative examinations: Secondary | ICD-10-CM

## 2014-10-08 ENCOUNTER — Ambulatory Visit (INDEPENDENT_AMBULATORY_CARE_PROVIDER_SITE_OTHER): Payer: BC Managed Care – PPO | Admitting: Internal Medicine

## 2014-10-08 ENCOUNTER — Encounter: Payer: Self-pay | Admitting: Internal Medicine

## 2014-10-08 VITALS — BP 88/66 | HR 83 | Ht 64.0 in | Wt 117.8 lb

## 2014-10-08 DIAGNOSIS — I480 Paroxysmal atrial fibrillation: Secondary | ICD-10-CM

## 2014-10-08 NOTE — Patient Instructions (Signed)
Your physician wants you to follow-up in: 12 months with Dr Allred You will receive a reminder letter in the mail two months in advance. If you don't receive a letter, please call our office to schedule the follow-up appointment.  

## 2014-10-09 NOTE — Progress Notes (Signed)
PCP:   Melinda Crutch, MD Primary Cardiologist:  Dr Meda Coffee  The patient presents today for routine electrophysiology followup.  Since last being seen in our clinic, the patient reports doing very well.  She wore an event monitor which revealed that her palpitations were due to sinus rhythm.  She did not have afib on her recent monitor but has had short/ nonsustained atrial fibrillation previously.  Today, she denies symptoms of chest pain, shortness of breath, orthopnea, PND, lower extremity edema, dizziness, presyncope, syncope, or neurologic sequela.  The patient feels that she is tolerating medications without difficulties and is otherwise without complaint today.   Past Medical History  Diagnosis Date  . Osteoporosis   . Breast cancer 2010    left  . Palpitations   . atrial fib    Past Surgical History  Procedure Laterality Date  . Tonsillectomy    . Dilation and curettage of uterus    . Breast surgery  2010    left mastectomy and snbx  . Breast biopsy  02/28/2012    Procedure: BREAST BIOPSY WITH NEEDLE LOCALIZATION;  Surgeon: Rolm Bookbinder, MD;  Location: [place];  Service: General;  Laterality: Right;  Right breast wire guided biopsy  . Breast reconstruction  04/23/2012    Procedure: BREAST RECONSTRUCTION;  Surgeon: Crissie Reese, MD;  Location: [place];  Service: Plastics;  Laterality: Bilateral;  bilateral breast reconstruction with Tissue Expander placement    Current Outpatient Prescriptions  Medication Sig Dispense Refill  . Calcium Carbonate-Vitamin D (CALCIUM + D PO) Take 1 tablet by mouth daily.     . rivaroxaban (XARELTO) 20 MG TABS tablet Take 1 tablet (20 mg total) by mouth daily. 30 tablet 1  . metoprolol tartrate (LOPRESSOR) 25 MG tablet Take 12.5 mg by mouth daily as needed (palpitations).      No current facility-administered medications for this visit.    Allergies  Allergen Reactions  . Amitriptyline Palpitations and Other  (See Comments)    And racing heart  . Sulfa Antibiotics Hives  . Tetracycline Hives    Patient thinks this is the name of the medication  . Tetracyclines & Related Hives  . Codeine Nausea And Vomiting    History   Social History  . Marital Status: Married    Spouse Name: N/A    Number of Children: 3  . Years of Education: 12   Occupational History  . Not on file.   Social History Main Topics  . Smoking status: Never Smoker   . Smokeless tobacco: Never Used  . Alcohol Use: No  . Drug Use: No  . Sexual Activity: Not on file   Other Topics Concern  . Not on file   Social History Narrative   Patient is married with 3 children.   Patient is right handed.   Patient has hs education.   Patient drinks 2 times a week.    Family History  Problem Relation Age of Onset  . Breast cancer Mother   . Colon cancer Father   . Hypertension Father   . Hypertension Maternal Grandmother   . Heart disease Maternal Grandfather     ROS-  All systems are reviewed and are negative except as outlined in the HPI above  Physical Exam: Filed Vitals:   10/08/14 0825  BP: 88/66  Pulse: 83  Height: 5\' 4"  (1.626 m)  Weight: 117 lb 12.8 oz (53.434 kg)    GEN- The patient is well appearing,  alert and oriented x 3 today.   Head- normocephalic, atraumatic Eyes-  Sclera clear, conjunctiva pink Ears- hearing intact Oropharynx- clear Neck- supple, no JVP Lymph- no cervical lymphadenopathy Lungs- Clear to ausculation bilaterally, normal work of breathing Heart- Regular rate and rhythm, no murmurs, rubs or gallops, PMI not laterally displaced GI- soft, NT, ND, + BS Extremities- no clubbing, cyanosis, or edema MS- no significant deformity or atrophy Skin- no rash or lesion Psych- euthymic mood, full affect Neuro- strength and sensation are intact  Event monitor is reviewed with the patient today Dr Phoebe Sharps notes are reviewed ekg today reveals sinus rhythm, septal infarct, nonspecific ST/T  changes  Assessment and Plan:  1. afib Well controlled and probably quite infrequent in episodes Recent event monitor reveals sinus rhythm as the cause for most of her palpitations She is reassured today Continue xarelto for chads2vasc score of 3.  Return in 1 year She will contact me if problems arise in the interim.  Thompson Grayer MD

## 2014-10-21 ENCOUNTER — Telehealth: Payer: Self-pay | Admitting: Internal Medicine

## 2014-10-21 NOTE — Telephone Encounter (Signed)
New msg       Pt would like a call back about changing from Metoprolol.   States bp remains too  low while on this medication.

## 2014-10-21 NOTE — Telephone Encounter (Signed)
lmom for patient to return my call 

## 2014-10-23 NOTE — Telephone Encounter (Addendum)
lmom for patient to return my call on her cell phone.  I called her on her home phone and she is wanting to know of another medication besides Metoprolol she can take for her tachycardia.  Dr Rayann Heman mentioned something to her on 12/23 but she can not remember the name of it.

## 2014-10-23 NOTE — Telephone Encounter (Signed)
Follow up  ° ° ° °Returning call back to nurse  °

## 2014-10-24 MED ORDER — METOPROLOL TARTRATE 25 MG PO TABS: 12.5000 mg | ORAL_TABLET | Freq: Every day | ORAL | Status: DC | PRN

## 2014-10-24 NOTE — Telephone Encounter (Signed)
Discussed with Dr Rayann Heman he feels what she is on is the best and she should continue unless she would like to take Nadolol 10mg  daily

## 2014-10-28 ENCOUNTER — Other Ambulatory Visit: Payer: Self-pay | Admitting: Internal Medicine

## 2014-10-29 NOTE — Telephone Encounter (Signed)
Patient aware and will call back if she has a problem

## 2014-10-30 ENCOUNTER — Telehealth: Payer: Self-pay | Admitting: Neurology

## 2014-10-30 NOTE — Telephone Encounter (Signed)
Pt needs refill on rivaroxaban (XARELTO) 20 MG TABS tablet she uses [facility] on [address].  Please call and advise

## 2014-10-30 NOTE — Telephone Encounter (Signed)
It appears Dr Rayann Heman sent this Rx to [facility] for one year supply on 01/12.  I called the patient back.  She is aware.

## 2014-11-27 ENCOUNTER — Telehealth: Payer: Self-pay | Admitting: Neurology

## 2014-11-27 NOTE — Telephone Encounter (Signed)
Pt is calling wanting to get the results of doppler studies.  Please call and advise.

## 2014-11-27 NOTE — Telephone Encounter (Signed)
Patient calling for Doppler results, please advise.

## 2014-11-27 NOTE — Telephone Encounter (Signed)
Called pt and delivered doppler result to her. She had some questions regarding Xarelto and cardiac event monitoring, and I answered all her questions. She is requesting an appointment with me and checking lipid panel. I'm going to set up an appointment with her in 1 months in the morning to do fasting lipid panel.  Hi, Tashia: Could you please set up an appointment with me in one month time in the morning so that we can do the fasting lipid panel test? Thanks a lot.  Rosalin Hawking, MD PhD Stroke Neurology 11/27/2014 5:14 PM

## 2014-11-28 NOTE — Telephone Encounter (Signed)
Spoke with patient and scheduled appointment for 01/19/15 at 8:15, patient states that she can only come on a Monday or Tuesday

## 2014-12-30 ENCOUNTER — Other Ambulatory Visit: Payer: Self-pay

## 2014-12-31 LAB — CYTOLOGY - PAP

## 2015-01-19 ENCOUNTER — Encounter: Payer: Self-pay | Admitting: Neurology

## 2015-01-19 ENCOUNTER — Ambulatory Visit (INDEPENDENT_AMBULATORY_CARE_PROVIDER_SITE_OTHER): Payer: BLUE CROSS/BLUE SHIELD | Admitting: Neurology

## 2015-01-19 VITALS — BP 96/66 | HR 77 | Ht 64.0 in | Wt 121.0 lb

## 2015-01-19 DIAGNOSIS — E785 Hyperlipidemia, unspecified: Secondary | ICD-10-CM

## 2015-01-19 DIAGNOSIS — G451 Carotid artery syndrome (hemispheric): Secondary | ICD-10-CM | POA: Diagnosis not present

## 2015-01-19 NOTE — Progress Notes (Signed)
STROKE NEUROLOGY FOLLOW UP NOTE  NAME: Candace Wells DOB: 1951/10/15  REASON FOR VISIT: stroke follow up HISTORY FROM: pt and chart  Today we had the pleasure of seeing Candace Wells in follow-up at our Neurology Clinic. Pt was accompanied by no one.   History Summary Candace Wells is a 64 y.o. female with h/o A fib followed by Dr. Meda Coffee not [facility], breast cancer in remission was admitted on 08/01/14 for left arm paresthesia distal to the elbow and involving fingers 4 and 5. Symptoms lasted around 1-2 hours. Has been having palpitations more frequently than usual. Denies CP, h/o stroke or TIA. On ASA 81 mg for previous chadsvasc score 1, and metoprolol for rate control. CT brain shows no acute abnormalities. MRI showed no acute infarct. Due to the pattern of paresthesia, concerning for C8 radiculopathy. MRI C-spine negative. TIA was not able to ruled out due to hx of afib and LUE paresthesia, she was put on Xarelto and lipitor for stroke prevention.  09/16/14 follow up - the patient has been doing well. No recurrent stroke like symptoms, but she continued to have palpitations. She followed up with Dr. Meda Coffee and continued Candace Wells, and cut down metoprolol due to low BP, as well as diet control for high LDL. She was also referred to Dr. Rayann Heman for potential AV ablation, but Dr. Rayann Heman wanted 30 day cardiac monitoring. She did not tolerate the monitoring well so the monitoring was discontinued prematurely. Currently she has no complains and her medication only include Xarelto daily and metoprolol PRN for palpitation.  Interval History During the interval time, she was seen by Dr. Rayann Heman and did 30 day cardiac event monitoring and did not see afib during the recording but sinus tachy. Although Dr. Meda Coffee documented on 08/06/14 in her note that she had Afib on 48h holter monitor, pt decided to take off Xarelto by herself. She is on baby ASA now. She feels good.   Discussed with her that  Afib could be intermittent and with possible Afib diagnosis and the possible TIA in 07/2014, she may be in higher risk for stroke if she takes only ASA. She expressed understanding and made it clear that she would like to take the risk and would like to be maintained on ASA.   She also has high cholesterol and currently on diet control with exercise. She will follow up with her PCP again soon. If still high, she may consider statin.   REVIEW OF SYSTEMS: Full 14 system review of systems performed and notable only for those listed below and in HPI above, all others are negative:  Constitutional: N/A  Cardiovascular: palpitations  Ear/Nose/Throat: N/A  Skin: N/A  Eyes: N/A  Respiratory: SOB, chest tightness  Gastroitestinal: N/A  Genitourinary: urinary frequency Hematology/Lymphatic: N/A  Endocrine: N/A  Musculoskeletal: N/A  Allergy/Immunology: N/A  Neurological: dizziness, HA  Psychiatric: N/A  The following represents the patient's updated allergies and side effects list: Allergies  Allergen Reactions  . Amitriptyline Palpitations and Other (See Comments)    And racing heart  . Sulfa Antibiotics Hives  . Tetracycline Hives    Patient thinks this is the name of the medication  . Tetracyclines & Related Hives  . Codeine Nausea And Vomiting    Labs since last visit of relevance include the following: Results for orders placed or performed in visit on 12/30/14  Cytology - PAP  Result Value Ref Range   CYTOLOGY - PAP PAP RESULT  The neurologically relevant items on the patient's problem list were reviewed on today's visit.  Neurologic Examination  A problem focused neurological exam (12 or more points of the single system neurologic examination, vital signs counts as 1 point, cranial nerves count for 8 points) was performed.  Blood pressure 96/66, pulse 77, height 5\' 4"  (1.626 m), weight 121 lb (54.885 kg).  General - Well nourished, well developed, in no apparent  distress.  Ophthalmologic - Sharp disc margins OU.  Cardiovascular - Regular rate and rhythm with no murmur.  Mental Status -  Level of arousal and orientation to time, place, and person were intact. Language including expression, naming, repetition, comprehension, reading, and writing was assessed and found intact. Attention span and concentration were normal. Recent and remote memory were intact. Fund of Knowledge was assessed and was intact.  Cranial Nerves II - XII - II - Visual field intact OU. III, IV, VI - Extraocular movements intact. V - Facial sensation intact bilaterally. VII - Facial movement intact bilaterally. VIII - Hearing & vestibular intact bilaterally. X - Palate elevates symmetrically. XI - Chin turning & shoulder shrug intact bilaterally. XII - Tongue protrusion intact.  Motor Strength - The patient's strength was normal in all extremities and pronator drift was absent.  Bulk was normal and fasciculations were absent.   Motor Tone - Muscle tone was assessed at the neck and appendages and was normal.  Reflexes - The patient's reflexes were normal in all extremities and she had no pathological reflexes.  Sensory - Light touch, temperature/pinprick, vibration and proprioception, and Romberg testing were assessed and were normal.    Coordination - The patient had normal movements in the hands and feet with no ataxia or dysmetria.  Tremor was absent.  Gait and Station - The patient's transfers, posture, gait, station, and turns were observed as normal.  Data reviewed: I personally reviewed the images and agree with the radiology interpretations.  Dg Chest 2 View 08/01/2014  COPD. No acute abnormality.   Ct Head Wo Contrast 08/01/2014  1. Mild chronic ethmoid sinusitis. Otherwise, no significant abnormalities are observed.   Mr Brain Wo Contrast 08/01/2014  No acute or subacute insult. Normal study with the exception of a very few tiny white  matter foci and not likely of clinical relevance.   MRI of the cervical spine - 1. Mild cervical spondylosis as described. 2. No disc herniation, significant spinal stenosis or nerve root encroachment identified.   2D echo - - Left ventricle: The cavity size was normal. Wall thickness was normal. Systolic function was normal. The estimated ejection fraction was in the range of 60% to 65%. Wall motion was normal; there were no regional wall motion abnormalities. Left ventricular diastolic function parameters were normal. - Aortic valve: Valve area (Vmax): 2.31 cm^2. - Atrial septum: No defect or patent foramen ovale was identified. Impressions: - No cardiac source of emboli was indentified.  CUS - negative for hemodynamic stenosis  Cardiac event monitoring - negative for afib but sinus tachy events.  Component     Latest Ref Rng 08/27/2013 08/02/2014  Cholesterol     0 - 200 mg/dL  159  Triglycerides     <150 mg/dL  73  HDL     >39 mg/dL  33 (L)  Total CHOL/HDL Ratio       4.8  VLDL     0 - 40 mg/dL  15  LDL (calc)     0 - 99 mg/dL  111 (H)  Hgb A1c MFr Bld     <5.7 %  5.7 (H)  Mean Plasma Glucose     <117 mg/dL  117 (H)  TSH     0.35 - 5.50 uIU/mL 1.43     Assessment: As you may recall, she is a 64 y.o. Caucasian female with PMH of afib (documented by Dr. Meda Coffee) not on ASA, breast cancer in remission was admitted on 08/01/14 for transient left UE numbness. The pattern of numbness not very clear as it has resolved but concerning for TIA vs. C8 radiculopathy. MRI no stroke and MRI C-spine minimal changes. For stroke prevention, she was put on Xarelto. She was then followed with cardiology for symptomatic palpitation and cardiac event monitoring showed no afib but sinus tachy. She then decided to off Xarelto and put on ASA 81mg  by herself. She understood the risk but would like to be remained on ASA. She is also on diet control for HLD.   Plan:  - continue ASA 81mg  for  stroke prevention - diet control for HLD as preferred by pt - pt understands that she might have higher risk of stroke only on ASA as she may have possible diagnosis of Afib and TIA. She would like to take the risk.  - Follow up with your primary care physician for stroke risk factor modification. Recommend maintain blood pressure goal <130/80, diabetes with hemoglobin A1c goal below 6.5% and lipids with LDL cholesterol goal below 70 mg/dL.  - RTC as needed.  No orders of the defined types were placed in this encounter.    Meds ordered this encounter  Medications  . co-enzyme Q-10 30 MG capsule    Sig: Take 30 mg by mouth 3 (three) times daily.  . Omega-3 Fatty Acids (FISH OIL) 1000 MG CAPS    Sig: Take by mouth.  . Vitamin D, Cholecalciferol, 1000 UNITS CAPS    Sig: Take by mouth.  . Multiple Vitamin (MULTIVITAMIN) capsule    Sig: Take 1 capsule by mouth daily.  Marland Kitchen aspirin 81 MG tablet    Sig: Take 81 mg by mouth daily.  Marland Kitchen UNABLE TO FIND    Sig: Cres Off  . fluticasone (FLONASE) 50 MCG/ACT nasal spray    Sig: Place into both nostrils daily.  . fexofenadine (ALLEGRA) 180 MG tablet    Sig: Take 180 mg by mouth daily.    Patient Instructions  - continue ASA 81 for stroke prevention - discussed with you that you should be aware that with possible "afib" and possible "TIA", you may have higher risk for stroke if only on ASA.  - Follow up with your primary care physician for stroke risk factor modification. Recommend maintain blood pressure goal <130/80, diabetes with hemoglobin A1c goal below 6.5% and lipids with LDL cholesterol goal below 70 mg/dL.  - diet control and exercise for cholestrol control - follow up as needed.     Rosalin Hawking, MD PhD [facility] [address] [place], [place] [address] [phone]

## 2015-01-19 NOTE — Patient Instructions (Signed)
-   continue ASA 81 for stroke prevention - discussed with you that you should be aware that with possible "afib" and possible "TIA", you may have higher risk for stroke if only on ASA.  - Follow up with your primary care physician for stroke risk factor modification. Recommend maintain blood pressure goal <130/80, diabetes with hemoglobin A1c goal below 6.5% and lipids with LDL cholesterol goal below 70 mg/dL.  - diet control and exercise for cholestrol control - follow up as needed.

## 2015-05-14 ENCOUNTER — Other Ambulatory Visit: Payer: Self-pay | Admitting: General Surgery

## 2015-05-14 DIAGNOSIS — C50919 Malignant neoplasm of unspecified site of unspecified female breast: Secondary | ICD-10-CM

## 2015-05-18 ENCOUNTER — Other Ambulatory Visit: Payer: Self-pay

## 2015-05-20 ENCOUNTER — Ambulatory Visit
Admission: RE | Admit: 2015-05-20 | Discharge: 2015-05-20 | Disposition: A | Discharge status: Home / Self Care | Payer: BLUE CROSS/BLUE SHIELD | Source: Ambulatory Visit | Attending: General Surgery | Admitting: General Surgery

## 2015-05-20 ENCOUNTER — Other Ambulatory Visit: Payer: Self-pay | Admitting: General Surgery

## 2015-05-20 DIAGNOSIS — N6310 Unspecified lump in the right breast, unspecified quadrant: Secondary | ICD-10-CM

## 2015-05-20 DIAGNOSIS — C50919 Malignant neoplasm of unspecified site of unspecified female breast: Secondary | ICD-10-CM

## 2015-06-15 ENCOUNTER — Other Ambulatory Visit: Payer: Self-pay | Admitting: General Surgery

## 2015-06-15 DIAGNOSIS — N6310 Unspecified lump in the right breast, unspecified quadrant: Secondary | ICD-10-CM

## 2015-08-24 ENCOUNTER — Ambulatory Visit
Admission: RE | Admit: 2015-08-24 | Discharge: 2015-08-24 | Disposition: A | Discharge status: Home / Self Care | Payer: BLUE CROSS/BLUE SHIELD | Source: Ambulatory Visit | Attending: General Surgery | Admitting: General Surgery

## 2015-08-24 DIAGNOSIS — N6310 Unspecified lump in the right breast, unspecified quadrant: Secondary | ICD-10-CM

## 2015-08-26 ENCOUNTER — Other Ambulatory Visit: Payer: Self-pay | Admitting: General Surgery

## 2015-08-27 ENCOUNTER — Other Ambulatory Visit: Payer: Self-pay | Admitting: General Surgery

## 2015-08-27 DIAGNOSIS — R928 Other abnormal and inconclusive findings on diagnostic imaging of breast: Secondary | ICD-10-CM

## 2015-08-27 DIAGNOSIS — N649 Disorder of breast, unspecified: Secondary | ICD-10-CM

## 2015-09-03 ENCOUNTER — Ambulatory Visit
Admission: RE | Admit: 2015-09-03 | Discharge: 2015-09-03 | Disposition: A | Discharge status: Home / Self Care | Payer: BLUE CROSS/BLUE SHIELD | Source: Ambulatory Visit | Attending: General Surgery | Admitting: General Surgery

## 2015-09-03 DIAGNOSIS — N649 Disorder of breast, unspecified: Secondary | ICD-10-CM

## 2015-09-03 MED ORDER — GADOBENATE DIMEGLUMINE 529 MG/ML IV SOLN
10.0000 mL | Freq: Once | INTRAVENOUS | Status: AC | PRN
  Administered 2015-09-03: 10 mL via INTRAVENOUS

## 2015-09-06 ENCOUNTER — Other Ambulatory Visit: Payer: BLUE CROSS/BLUE SHIELD

## 2015-09-08 ENCOUNTER — Other Ambulatory Visit: Payer: BLUE CROSS/BLUE SHIELD

## 2015-10-26 ENCOUNTER — Ambulatory Visit (INDEPENDENT_AMBULATORY_CARE_PROVIDER_SITE_OTHER): Payer: BLUE CROSS/BLUE SHIELD | Admitting: Internal Medicine

## 2015-10-26 ENCOUNTER — Encounter: Payer: Self-pay | Admitting: Internal Medicine

## 2015-10-26 VITALS — BP 102/78 | HR 85 | Ht 64.0 in | Wt 122.4 lb

## 2015-10-26 DIAGNOSIS — I480 Paroxysmal atrial fibrillation: Secondary | ICD-10-CM | POA: Diagnosis not present

## 2015-10-26 NOTE — Progress Notes (Signed)
PCP:   Melinda Crutch, MD Primary Cardiologist:  Dr Meda Coffee  The patient presents today for routine electrophysiology followup.  Since last being seen in our clinic, the patient reports doing very well.  She wore an event monitor which revealed that her palpitations were due to sinus rhythm.  She did not have afib on her prior monitor but has had short/ nonsustained atrial fibrillation only.  Today, she denies symptoms of chest pain, shortness of breath, orthopnea, PND, lower extremity edema, dizziness, presyncope, syncope, or neurologic sequela.  Her palpitations are worse with stress.  Otherwise, she is doing well.  After meeting with Dr Erlinda Hong in April and speaking with primary care and a family member who is a physician, she decides to stop anticoagulation. The patient feels that she is tolerating medications without difficulties and is otherwise without complaint today.   Past Medical History  Diagnosis Date  . Osteoporosis   . Breast cancer ([place]) 2010    left  . Palpitations   . atrial fib    Past Surgical History  Procedure Laterality Date  . Tonsillectomy    . Dilation and curettage of uterus    . Breast surgery  2010    left mastectomy and snbx  . Breast biopsy  02/28/2012    Procedure: BREAST BIOPSY WITH NEEDLE LOCALIZATION;  Surgeon: Rolm Bookbinder, MD;  Location: [place];  Service: General;  Laterality: Right;  Right breast wire guided biopsy  . Breast reconstruction  04/23/2012    Procedure: BREAST RECONSTRUCTION;  Surgeon: Crissie Reese, MD;  Location: [place];  Service: Plastics;  Laterality: Bilateral;  bilateral breast reconstruction with Tissue Expander placement    Current Outpatient Prescriptions  Medication Sig Dispense Refill  . aspirin 81 MG tablet Take 81 mg by mouth daily.    . Calcium Carbonate-Vitamin D (CALCIUM + D PO) Take 1 tablet by mouth daily.     . Coenzyme Q10 (CO Q 10 PO) Take by mouth as directed.    . fexofenadine  (ALLEGRA) 180 MG tablet Take 180 mg by mouth daily.    . fluticasone (FLONASE) 50 MCG/ACT nasal spray Place into both nostrils daily.    . Multiple Vitamin (MULTIVITAMIN) capsule Take 1 capsule by mouth daily.    . propranolol (INDERAL) 20 MG tablet Take 10-20 mg by mouth 2 (two) times daily as needed (Tachycardia).    Marland Kitchen UNABLE TO FIND as directed. Cres Off    . Vitamin D, Cholecalciferol, 1000 UNITS CAPS Take 1 capsule by mouth daily.      No current facility-administered medications for this visit.    Allergies  Allergen Reactions  . Amitriptyline Palpitations and Other (See Comments)    And racing heart  . Sulfa Antibiotics Hives  . Tetracycline Hives    Patient thinks this is the name of the medication  . Tetracyclines & Related Hives  . Codeine Nausea And Vomiting    Social History   Social History  . Marital Status: Married    Spouse Name: N/A  . Number of Children: 3  . Years of Education: 12   Occupational History  . Not on file.   Social History Main Topics  . Smoking status: Never Smoker   . Smokeless tobacco: Never Used  . Alcohol Use: No  . Drug Use: No  . Sexual Activity: Not on file   Other Topics Concern  . Not on file   Social History Narrative   Patient is married  with 3 children.   Patient is right handed.   Patient has hs education.   Patient drinks 2 times a week.    Family History  Problem Relation Age of Onset  . Breast cancer Mother   . Colon cancer Father   . Hypertension Father   . Hypertension Maternal Grandmother   . Heart disease Maternal Grandfather     ROS-  All systems are reviewed and are negative except as outlined in the HPI above  Physical Exam: Filed Vitals:   10/26/15 1215  BP: 102/78  Pulse: 85  Height: 5\' 4"  (1.626 m)  Weight: 122 lb 6.4 oz (55.52 kg)    GEN- The patient is well appearing, alert and oriented x 3 today.   Head- normocephalic, atraumatic Eyes-  Sclera clear, conjunctiva pink Ears- hearing  intact Oropharynx- clear Neck- supple, no JVP Lymph- no cervical lymphadenopathy Lungs- Clear to ausculation bilaterally, normal work of breathing Heart- Regular rate and rhythm, no murmurs, rubs or gallops, PMI not laterally displaced GI- soft, NT, ND, + BS Extremities- no clubbing, cyanosis, or edema MS- no significant deformity or atrophy Skin- no rash or lesion Psych- euthymic mood, full affect Neuro- strength and sensation are intact  Dr Phoebe Sharps notes are reviewed ekg today reveals sinus rhythm, incomplete RBBB, nonspecific ST/T changes  Assessment and Plan:  1. afib Well controlled and probably quite infrequent in episodes Prior event monitor reveals sinus rhythm as the cause for most of her palpitations  She is clear in her decision to decline anticoagulation even though chads2vasc score of 3.  We discussed this at length today and I have advised that she reconsider anticoagulation.  She may be more willing to do this if her AF burden increases  Regular exercise and stress reduction is encouraged today  Return in 1 year She will contact me if problems arise in the interim.  Thompson Grayer MD, [place] 10/26/2015 12:37 PM

## 2015-10-26 NOTE — Patient Instructions (Signed)
Medication Instructions:  Your physician recommends that you continue on your current medications as directed. Please refer to the Current Medication list given to you today.   Labwork: None ordered   Testing/Procedures: None ordered   Follow-Up: Your physician wants you to follow-up in: 12 months with Dr Rayann Heman Dennis Bast will receive a reminder letter in the mail two months in advance. If you don't receive a letter, please call our office to schedule the follow-up appointment.   Any Other Special Instructions Will Be Listed Below (If Applicable).  Regular Exercise      If you need a refill on your cardiac medications before your next appointment, please call your pharmacy.

## 2016-01-26 ENCOUNTER — Other Ambulatory Visit: Payer: Self-pay | Admitting: General Surgery

## 2016-01-26 DIAGNOSIS — N63 Unspecified lump in unspecified breast: Secondary | ICD-10-CM

## 2016-02-29 ENCOUNTER — Ambulatory Visit
Admission: RE | Admit: 2016-02-29 | Discharge: 2016-02-29 | Disposition: A | Discharge status: Home / Self Care | Payer: Medicare Other | Source: Ambulatory Visit | Attending: General Surgery | Admitting: General Surgery

## 2016-02-29 DIAGNOSIS — N63 Unspecified lump in unspecified breast: Secondary | ICD-10-CM

## 2016-03-09 DIAGNOSIS — C50912 Malignant neoplasm of unspecified site of left female breast: Secondary | ICD-10-CM

## 2016-03-09 DIAGNOSIS — C50911 Malignant neoplasm of unspecified site of right female breast: Secondary | ICD-10-CM | POA: Insufficient documentation

## 2016-06-17 ENCOUNTER — Telehealth: Payer: Self-pay | Admitting: Oncology

## 2016-06-17 NOTE — Telephone Encounter (Signed)
Spoke with pt to confirm Oct appt per referral message 9/1

## 2016-07-25 ENCOUNTER — Encounter: Payer: Self-pay | Admitting: Genetic Counselor

## 2016-07-26 ENCOUNTER — Other Ambulatory Visit: Payer: Medicare Other

## 2016-07-26 ENCOUNTER — Ambulatory Visit: Payer: Medicare Other | Admitting: Genetic Counselor

## 2016-07-26 DIAGNOSIS — Z1501 Genetic susceptibility to malignant neoplasm of breast: Secondary | ICD-10-CM

## 2016-07-26 DIAGNOSIS — Z1509 Genetic susceptibility to other malignant neoplasm: Principal | ICD-10-CM

## 2016-07-26 DIAGNOSIS — Z1589 Genetic susceptibility to other disease: Secondary | ICD-10-CM

## 2016-07-26 DIAGNOSIS — Z806 Family history of leukemia: Secondary | ICD-10-CM

## 2016-07-26 DIAGNOSIS — Z809 Family history of malignant neoplasm, unspecified: Secondary | ICD-10-CM

## 2016-07-26 DIAGNOSIS — Z853 Personal history of malignant neoplasm of breast: Secondary | ICD-10-CM

## 2016-07-26 DIAGNOSIS — Z803 Family history of malignant neoplasm of breast: Secondary | ICD-10-CM

## 2016-07-30 ENCOUNTER — Encounter: Payer: Self-pay | Admitting: Genetic Counselor

## 2016-07-30 NOTE — Progress Notes (Signed)
REFERRING PROVIDER: Rolm Bookbinder, MD  PRIMARY PROVIDER:   Melinda Crutch, MD  PRIMARY REASON FOR VISIT:  1. Monoallelic mutation of PALB2 gene   2. History of bilateral breast cancer   3. Family history of breast cancer   4. Family history of leukemia   5. Family history of cancer      HISTORY OF PRESENT ILLNESS:   Candace Wells, a 65 y.o. female, was seen for a  cancer genetics consultation at the request of Dr. Donne Hazel due to a personal history of a known pathogenic PALB2 gene mutation and personal and family history of breast cancer.  Candace Wells presents to clinic today to discuss her diagnosis and to review what we know about PALB2 gene mutations, cancer risks, and medical management recommendations.  In April 2010, at the age of 50, Candace Wells was diagnosed with invasive ductal carcinoma with DCIS of the left breast.  Hormone receptor status was ER+, PR-, and Her2-.  In May 2013, at the age of 50, Candace Wells was diagnosed with DCIS of the right breast.  Hormone receptor status was ER/PR+.  Candace Wells has since had bilateral mastectomies.  She reports no additional personal history of cancer.  Candace Wells previously had negative BRCA1/2 genetic testing.  Then her daughter had genetic testing through a [place] study which showed that she has a pathogenic PALB2 gene mutation.  Candace Wells has also tested positive for this PALB2 gene mutation at the same laboratory.  She does not have a copy of her test result with her today, but she states that she has shared this report with Dr. Donne Hazel.     HORMONAL RISK FACTORS:  Menarche was at age 34.  First live birth at age 42.  OCP use for approximately 1 years.  Ovaries intact: yes.  Hysterectomy: no.  Menopausal status: postmenopausal.  HRT use: 0 years. Colonoscopy: yes, had her first colonoscopy in 2015; one adenoma found; she is scheduled to return for her next colonoscopy in 2018. Mammogram within the last year:  n/a. Number of breast biopsies: multiple. Up to date with pelvic exams:  yes. 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 atrial fib   . Breast cancer ([place]) 2010   left  . Osteoporosis   . Palpitations     Past Surgical History:  Procedure Laterality Date  . BREAST BIOPSY  02/28/2012   Procedure: BREAST BIOPSY WITH NEEDLE LOCALIZATION;  Surgeon: Rolm Bookbinder, MD;  Location: [facility];  Service: General;  Laterality: Right;  Right breast wire guided biopsy  . BREAST RECONSTRUCTION  04/23/2012   Procedure: BREAST RECONSTRUCTION;  Surgeon: Crissie Reese, MD;  Location: [facility];  Service: Plastics;  Laterality: Bilateral;  bilateral breast reconstruction with Tissue Expander placement  . BREAST SURGERY  2010   left mastectomy and snbx  . DILATION AND CURETTAGE OF UTERUS    . TONSILLECTOMY      Social History   Social History  . Marital status: Married    Spouse name: N/A  . Number of children: 3  . Years of education: 81   Social History Main Topics  . Smoking status: Never Smoker  . Smokeless tobacco: Never Used  . Alcohol use No  . Drug use: No  . Sexual activity: Not on file   Other  Topics Concern  . Not on file   Social History Narrative   Patient is married with 3 children.   Patient is right handed.   Patient has hs education.   Patient drinks 2 times a week.     FAMILY HISTORY:  We obtained a detailed, 4-generation family history.  Significant diagnoses are listed below: Family History  Problem Relation Age of Onset  . Breast cancer Mother 51    (x3) breast primaries  . Hypertension Father   . Colon polyps Father 39    "cancerous" vs "precancerous colon polyp   . Hypertension Maternal Grandmother   . Heart disease Maternal Grandfather   . Heart Problems  Maternal Grandfather     d. 64s  . Breast cancer Maternal Aunt 58    s/p lumpectomy and chemo; tested negative for familial PALB2 gene mutation  . Ulcers Paternal Uncle     d. 21 due to "bleeding ulcers"  . Heart Problems Paternal Grandmother     d. 14  . Other Daughter     PALB2 gene mutation  . Leukemia Other 3    niece  . Leukemia Cousin     maternal 1st cousin d. 4y  . Breast cancer Cousin     maternal 1st cousin dx. mid to late 21s  . Breast cancer Other     maternal great aunt (MGM's sister)  . Ovarian cancer Other     maternal great aunt (MGM's sister), d. 65s  . Breast cancer Other     maternal great aunt (MGM's sister)  . Breast cancer Other     maternal great uncle (MGM's brother)  . Breast cancer Other     maternal great aunt (MGF's sister); d. young age  . Breast cancer Other     maternal great aunt (MGF's sister)  . Cancer Paternal Uncle     dx. laryngeal cancer in his 8s; smoker and radiation exposure from work on submarine; d. 57s    Candace Wells has three daughters, ages 54-45.  Her middle daughter was the first to test positive for the known familial PALB2 gene mutation.  Her youngest daughter will have genetic testing soon, and her oldest daughter has not yet decided to have genetic testing.  Candace Wells has four granddaughters and four grandsons.  Candace Wells. Kashani has one full sister and brother, ages 25 and 27.  Neither of her siblings have had genetic testing yet.  Her brother has four daughters, one of whom was diagnosed with leukemia at the age of 30 and is doing well now.  Candace Wells. Vecchiarelli sister's two children are cancer-free.    Candace Wells mother was first diagnosed with breast cancer at the age of 65.  She had three separate breast cancers, and she passed away from cancer at 73.  She had two full sisters and four full brothers.  Three of the brothers have passed away.  One died in his 20s, in an accident.  He had three children, two of whom had cancer.  His daughter  died from leukemia at the age of 30.  His other daughter was diagnosed with breast cancer in her mid- to late-60s.  The other two brothers passed away at later ages.  One brother is currently in his 31s and has never had cancer.  Both sisters are still living.  One sister is 36 and has never had cancer.  The other sister was diagnosed with breast cancer at 16 and is currently 74.  This  sister had genetic testing for the familial PALB2 mutation and she tested negative.  Candace Wells reports no other known cancers for her other maternal first cousins.  Her maternal grandmother died at 34 and never had cancer.  She had two sisters with a history of breast cancers, one of whom had two breast primaries and also was diagnosed with ovarian cancer.  She also had a brother who had breast cancer.  Candace Wells maternal grandfather died of heart problems in his 33s.  He had two sisters who also had breast cancer.  Candace Wells does report a history of breast cancer in two maternal first cousins, once-removed.  She is unaware of any other maternal relatives who have had genetic testing.  Candace Wells father is currently 18.  He describes to her that he had a "cancerous polyp" at the age of 24, but she thinks this may have been a pre-cancerous polyp instead, as he just had a polypectomy and did not have a colon resection.  He had two full brothers and one full sister.  His sister died at the age of 18 and never had cancer.  One brother died due to "bleeding ulcers" at the age of 77.  Another brother was diagnosed with and died from laryngeal cancer in his 7s.  This latter brother was a smoker and also was exposed to radiation while working on a submarine for a long period of time.  Candace Wells has some limited information for her paternal first cousins. Her paternal grandmother died of heart-related causes at 55.  Her paternal grandfather died of age-related causes at 85.  Candace Wells does report that she is aware that two  paternal first cousins, once-removed have been diagnosed with unspecified types of cancer in their 21s.    Patient's maternal ancestors are of [place], [place], and [place] descent, and paternal ancestors are of [place] and Native American/[place] descent. There is no reported Ashkenazi Jewish ancestry. There is no known consanguinity.  GENETIC COUNSELING ASSESSMENT: RANESSA KOSTA is a 65 y.o. female with a PALB2 gene mutation which causes her to be at an increased risk for breast cancer and a somewhat increased risk for pancreatic cancer.  We may learn about additional cancer risks as we learn more about the PALB2 gene.  We, therefore, discussed and recommended the following at today's visit.   DISCUSSION: While we would still like to verify the actual pathogenic PALB2 mutation and the extent of genetic testing that Candace Wells. [facility] has had, it seems like it is unlikely that further genetic testing is needed at this time.  We discussed that pathogenic PALB2 gene mutations cause an increased breast cancer risk for women (approximately 33-58% risk to age 68).    The [employer]  (NCCN) publishes recommendations for medical management for men and women who test positive for a pathogenic PALB2 gene mutation.  These recommendations are discussed below (v 1.2018):  Increased Breast Cancer Risk Beginning at age 27: annual mammogram and consider annual tomosynthesis and breast MRI with contrast Risk-reducing mastectomies: manage based on family history.  Candace Wells. Texeira has already reduced her breast cancer risk through bilateral mastectomies.    Increased Pancreatic Cancer Risk Previous NCCN guidelines versions have listed a somewhat increased risk for pancreatic cancer.  The current version of guidelines lists unknown or insufficient evidence for this cancer risk.  Screening recommendations should be based on family history.    There is no known family history of pancreatic  cancer in Candace Wells.  Masur's family, so it is unlikely that any specific recommendations should be made at this time.  Other Cancer Risks Unknown or insufficient evidence to suggest increased risk for ovarian, prostate, female breast, or other cancer risks.  Additional cancer risks may be determined as we learn more about the PALB2 gene.    Candace Wells. Lafontant should keep her contact information up-to-date with [facility], so that we may inform her as more information becomes available and as screening guidelines are updated in the future.    FAMILY MEMBERS: We discussed with Candace Wells. Vandervort that her daughters and her siblings who have not yet had genetic testing are at at 50% chance of having also inherited this PALB2 gene mutation.  Candace Wells. Charbonneau has already made these relatives aware of the familial mutation and the importance of genetic counseling and testing for themselves.  Though we have not yet verified the presence of this mutation in Candace Wells. Amaker's maternal family.  Her mother's side is by far the most suspicious for origin of this mutation.  She should make all of her maternal family members aware of the presence of this mutation in the family and the availability of genetic counseling and testing.  In terms of Candace Wells. Julio's grandchildren who are found to be at-risk for this mutation, we discussed that we do not test children under the age of 38, as we do not typically see PALB2-related cancers diagnosed in childhood and because, at this time, screening would not begin until they reach the age of 82.    We are happy to see any relatives for genetic counseling and testing.  Relatives who live in other cities can use the website of the [employer] (www. [employer].ch) to search for a cancer Dietitian by zip code.    A NOTE ABOUT FANCONI ANEMIA: Homozygous PALB2 mutation carriers (individuals with two PALB2 gene mutations) have a very rare condition called Fanconi Anemia, type N. Fanconi anemia  type N increases the risk of several childhood cancers, including a rare form of kidney cancer called Wilms' tumor and a brain tumor called medulloblastoma. Additionally, people with Fanconi anemia experience bone marrow suppression, which causes an abnormal reduction in the number of red blood cells, white blood cells, and blood platelets made by the bone marrow. The reduced production of red blood cells causes the anemia characteristic of this disorder.  It is very unlikely that any of Candace Wells. Belizaire's family members are affected by this condition.  However, for a heterozygous carrier of the familial PALB2 mutation, if they are concerned about their risks to pass Fanconi anemia onto their children, they can consider carrier testing for their partner.  A prenatal genetic counselor would also be a helpful resource to continue this discussion with interested relatives, and the best time to have this discussion is one is thinking about becoming pregnant.  Lastly, we encouraged Candace Wells. [place] to remain in contact with cancer genetics annually so that we can continuously update the family history and inform her of any changes in cancer genetics and testing that may be of benefit for this family.   Candace Wells.  Markman questions were answered to her satisfaction today. We will provide her with a letter packet that includes a results letter and a family letter for sharing with at-risk relatives.  We discussed that we would still like to verify Candace Wells. Kent's genetic test result and extent of genetic testing with a copy of her report.  We will try to obtain this  from Dr. Donne Hazel; she knows that we will be in touch with her, if we are unable to do so.    Our contact information was provided should additional questions or concerns arise. Thank you for the referral and allowing [place] to share in the care of your patient.   Jeanine Luz, Candace Wells, [facility] Certified Genetic Counselor [email or web address] Phone: [phone]  The patient  was seen for a total of 50 minutes in face-to-face genetic counseling.  This patient was discussed with Drs. Magrinat, Lindi Adie and/or Burr Medico who agrees with the above.    _______________________________________________________________________ For Office Staff:  Number of people involved in session: 1 Was an Intern/ student involved with case: no

## 2016-07-31 NOTE — Progress Notes (Signed)
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 Candace Wells her self visit ahead and got tested and She carries a mutation as well. Candace Wells is other 2 daughters have also been tested but results are pending. Candace Wells is here to discuss those results  REVIEW OF SYSTEMS: She has had some minor irregularities in her reconstructed breasts and these are being followed with with ultrasonography. She also had an MRI of Candace breast last November. There has been no evidence of disease recurrence. He continues to follow-up with cardiology for her history of palpitations, but that is very stable. Aside from some anxiety, a detailed review of systems today was otherwise noncontributory   HISTORY OF BREAST CANCER:  From Candace original intake note:  Candace Wells had "busy breasts" and has had multiple biopsies in both breasts, primarily Candace left breast, where biopsies in February of 2001 and August of 2001 showed ADH.  Most recently, she palpated a mass in her left breast which showed it to be quite dense.  Candace patient reported a palpable mass in Candace 12 o'clock position, approximately 1-2 cm superior to Candace nipple, and this was confirmed on exam by Candace Wells.  Diagnostic mammography on March 31st showed again very stable breasts with no areas of distortion or microcalcifications.  Mammograms were negative.  However, Candace ultrasound showed hypoechoic solid mass in Candace area in question measuring up to 1 cm.  There was no shadowing associated with this.  Biopsy was obtained on April 5th under ultrasound guidance, and this  showed (Candace Wells and Candace Wells) an invasive breast cancer which was ER "positive" at 5%, PR negative at 0% with a proliferation marker of 49%, and HER-2 negative by CISH with a ratio of 1.26.    With this information, Candace patient was referred to Candace Wells and given Candace fact that she had had multiple breast biopsies previously, Candace patient opted for left mastectomy with sentinel lymph node sampling.  Breast MRI showed only Candace mass in question and it measured 3.2 cm by MRI. Candace definitive surgery was performed on April 21st and showed (Candace Wells) an invasive ductal carcinoma, grade 3; measuring between 1.5 and 2.0 cm, with 0 of 3 sentinel lymph nodes involved.  There was no lymphovascular invasion and margins were ample.  Her subsequent history is as detailed below   PAST MEDICAL HISTORY: Past Medical History:  Diagnosis Date  . atrial fib   . Breast cancer ([place]) 2010   left  . Osteoporosis   . Palpitations     PAST SURGICAL HISTORY: Past Surgical History:  Procedure Laterality Date  . BREAST BIOPSY  02/28/2012   Procedure: BREAST BIOPSY WITH NEEDLE LOCALIZATION;  Surgeon: Candace Bookbinder, MD;  Location: [place];  Service: General;  Laterality: Right;  Right breast wire guided biopsy  . BREAST RECONSTRUCTION  04/23/2012   Procedure: BREAST RECONSTRUCTION;  Surgeon: Candace Reese, MD;  Location: [place];  Service: Plastics;  Laterality: Bilateral;  bilateral breast reconstruction with Tissue Expander placement  . BREAST SURGERY  2010   left mastectomy  and snbx  . DILATION AND CURETTAGE OF UTERUS    . TONSILLECTOMY      FAMILY HISTORY Family History  Problem Relation Age of Onset  . Breast cancer Mother 17    (x3) breast primaries  . Hypertension Father   . Colon polyps Father 38    "cancerous" vs "precancerous colon polyp   . Hypertension Maternal Grandmother   . Heart disease Maternal Grandfather   . Heart Problems Maternal Grandfather      d. 23s  . Breast cancer Maternal Aunt 58    s/p lumpectomy and chemo; tested negative for familial PALB2 gene mutation  . Ulcers Paternal Uncle     d. 21 due to "bleeding ulcers"  . Heart Problems Paternal Grandmother     d. 16  . Other Daughter     PALB2 gene mutation  . Leukemia Other 3    niece  . Leukemia Cousin     maternal 1st cousin d. 4y  . Breast cancer Cousin     maternal 1st cousin dx. mid to late 52s  . Breast cancer Other     maternal great aunt (MGM's sister)  . Ovarian cancer Other     maternal great aunt (MGM's sister), d. 37s  . Breast cancer Other     maternal great aunt (MGM's sister)  . Breast cancer Other     maternal great uncle (MGM's brother)  . Breast cancer Other     maternal great aunt (MGF's sister); d. young age  . Breast cancer Other     maternal great aunt (MGF's sister)  . Cancer Paternal Uncle     dx. laryngeal cancer in his 16s; smoker and radiation exposure from work on submarine; d. 64s  Candace patient's father is alive at age 54.  Her mother was diagnosed with breast cancer at age 31 and died of a bone marrow transplant for breast cancer at age 58.  Candace patient has one sister and one brother, both alive and in good health.  There is considerable breast cancer history on Candace patient's mother's side. Candace patient's mother's sister had breast cancer.  Candace patient's maternal grandmother and three of her sisters and her brother all had breast cancer according to Candace patient.  Nevertheless, Candace patient has been tested for BRCA-1 and she tells me she has been found to be negative.  GYNECOLOGIC HISTORY: Candace patient is GX P3, first pregnancy to term at age 29.  She went through Candace change of life about 2004.  She did not take hormone replacement.    SOCIAL HISTORY: She works as a Theme park manager.  Her husband of >40 years, Candace Wells "Candace [place]" - is working currently as a Optometrist and is semiretired.  Candace patient's daughter Candace Wells has four children and  is married to Candace Wells, one of our ER physicians. They live in [place].  Daughter Candace Wells is getting her PhD at [place].  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  She is divorced and has a son.  Daughter Aldona Lento lives in [place] and works there as a Theme park manager.  Candace patient is a [employer].      ADVANCED DIRECTIVES: Not in place  HEALTH MAINTENANCE: Social History  Substance Use Topics  . Smoking status: Never Smoker  . Smokeless tobacco: Never Used  . Alcohol use No     Colonoscopy:  PAP: March 2016  Bone density:  Lipid panel:  Allergies  Allergen Reactions  . Amitriptyline Palpitations  and Other (See Comments)    And racing heart  . Sulfa Antibiotics Hives  . Tetracycline Hives    Patient thinks this is Candace name of Candace medication  . Tetracyclines & Related Hives  . Codeine Nausea And Vomiting    Current Outpatient Prescriptions  Medication Sig Dispense Refill  . aspirin 81 MG tablet Take 81 mg by mouth daily.    . Calcium Carbonate-Vitamin D (CALCIUM + D PO) Take 1 tablet by mouth daily.     . Coenzyme Q10 (CO Q 10 PO) Take by mouth as directed.    . fexofenadine (ALLEGRA) 180 MG tablet Take 180 mg by mouth daily.    . fluticasone (FLONASE) 50 MCG/ACT nasal spray Place into both nostrils daily.    . Multiple Vitamin (MULTIVITAMIN) capsule Take 1 capsule by mouth daily.    . propranolol (INDERAL) 20 MG tablet Take 10-20 mg by mouth 2 (two) times daily as needed (Tachycardia).    Marland Kitchen UNABLE TO FIND as directed. Cres Off    . Vitamin D, Cholecalciferol, 1000 UNITS CAPS Take 1 capsule by mouth daily.      No current facility-administered medications for this visit.     OBJECTIVE: Middle-aged white woman In no acute distress Vitals:   08/01/16 0938  BP: (!) 107/49  Wells: 89  Resp: 18  Temp: 98.2 F (36.8 C)     Body mass index is 22.23 kg/m.    ECOG FS: 1  Filed Weights   08/01/16 0938  Weight: 129 lb 8 oz (58.7 kg)   Sclerae unicteric, EOMs  intact Oropharynx clear and moist No cervical or supraclavicular adenopathy Lungs no rales or rhonchi Heart regular rate and rhythm Abd soft, nontender, positive bowel sounds MSK no focal spinal tenderness, no upper extremity lymphedema Neuro: nonfocal, well oriented, appropriate affect Breasts: Status post bilateral mastectomies with bilateral implant reconstruction. There is no evidence of local recurrence. Both axillae are benign.   LAB RESULTS: Lab Results  Component Value Date   WBC 7.3 08/01/2014   NEUTROABS 5.5 08/01/2014   HGB 13.2 08/01/2014   HCT 39.2 08/01/2014   MCV 92.5 08/01/2014   PLT 226 08/01/2014      Chemistry      Component Value Date/Time   NA 141 08/01/2014 1407   NA 140 10/29/2012 1424   K 4.2 08/01/2014 1407   K 4.1 10/29/2012 1424   CL 101 08/01/2014 1407   CL 106 10/29/2012 1424   CO2 28 08/01/2014 1407   CO2 28 10/29/2012 1424   BUN 20 08/01/2014 1407   BUN 19.0 10/29/2012 1424   CREATININE 0.74 08/01/2014 1407   CREATININE 0.8 10/29/2012 1424      Component Value Date/Time   CALCIUM 9.8 08/01/2014 1407   CALCIUM 9.6 10/29/2012 1424   ALKPHOS 68 08/27/2013 1633   ALKPHOS 96 10/29/2012 1424   AST 26 08/27/2013 1633   AST 24 10/29/2012 1424   ALT 22 08/27/2013 1633   ALT 20 10/29/2012 1424   BILITOT 0.7 08/27/2013 1633   BILITOT 0.32 10/29/2012 1424       Lab Results  Component Value Date   LABCA2 17 10/29/2012    STUDIES: CLINICAL DATA:  History of bilateral mastectomies. Short-term interval follow-up of probable benign palpable nodules in Candace 10 o'clock region of Candace right breast. Patient had an MRI of Candace breast on 09/03/2015 which showed no suspicious enhancement.  EXAM: ULTRASOUND OF Candace RIGHT BREAST  COMPARISON:  With priors.  FINDINGS: On physical exam, I palpate BB sized nodules in Candace right breast at 10 o'clock 9 cm from Candace nipple and at 10 o'clock 8 cm from Candace nipple. Patient states that clinically they  are stable from Candace prior study dated 09/03/2015.  Targeted ultrasound is performed, showing a hypoechoic nodule in Candace right breast at 10 o'clock 9 cm from Candace nipple measuring 3 x 5 x 3 mm. There is a he hypoechoic nodule in Candace right breast at 10 o'clock 8 cm from Candace nipple measuring 2 x 2 x 2 mm. These previously measured 3 x 2 x 4 mm and 1 x 1 x 2 mm. They are felt to likely be benign.  IMPRESSION: Stable probable benign nodules in Candace right breast.  RECOMMENDATION: Short-term interval followup right breast ultrasound in 1 year is recommended. Candace importance of clinical exam was discussed with Candace patient.  I have discussed Candace findings and recommendations with Candace patient. Results were also provided in writing at Candace conclusion of Candace visit. If applicable, a reminder letter will be sent to Candace patient regarding Candace next appointment.  BI-RADS CATEGORY  3: Probably benign finding(s) - short interval follow-up suggested.   Electronically Signed   By: Lillia Mountain M.D.   On: 02/29/2016 13:35   ASSESSMENT: 65 y.o. BRCA negative but PALB2 positive  [place] woman   (1) status post left mastectomy and sentinel lymph node dissection in April 2010 for a T1c N0, stage IA invasive ductal carcinoma, grade 3, ER "positive" at 5%, PR and HER2/neu negative, with an MIB-1 of 49%; treated adjuvantly with 4 cycles of dose dense doxorubicin/ cyclophosphamide completed June 2010.  On tamoxifen very briefly, discontinued due to intolerance. Decided to forego aromatase inhibitors due to her history of osteopenia.   (2) status post right simple mastectomy in July 2013 for a high-grade ductal carcinoma in situ, strongly estrogen and progesterone receptor positive, both at 100%, grade 3, with clear margins. Candace single sentinel lymph node was clear.  (3)  completedf implant reconstruction bilaterally under Candace care of Dr. 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 Accordingly one wouldn't expect that Candace downstream risks of developing cancer would be similar for both mutations and that certainly is Candace case as far as breast cancer is concerned..  Candace problem is that we have a lot of data for BRCA2 mutations but much less data for PALB2 mutations. We suspect we will find an increased risk of prostate, ovarian, and pancreatic cancers once this particular mutation becomes more steady, but at this point we can all discussed potential risk.  As far as breast cancer risk is concerned, she is already status post bilateral mastectomies so there are no further concerns.  As far as ovarian cancer risk is concerned, and Candace fact that bilateral salpingo-oophorectomy is a relatively minor procedure, that would be an option. It turns out that Candace Wells does have  a great aunt who had ovarian cancer (in addition to breast cancer). As an alternative, she could undergo screening for ovarian cancer, with Candace understanding that no specific ovarian cancer screening protocol has yet been validated prospectively.  We discussed pancreatic cancer screening, which is cumbersome and expensive. It would also put her at risk of false positives. Finally in Candace absence of any relative with pancreatic cancer, it is not recommended. (She would not  qualify for Candace current screening studies enrolling patients with PALB2 mutations plus a family history of pancreatic cancer).  Finally we discussed Candace fact that if both PALB2 genes are mutated, Candace result is Fanconi anemia. This is some importance in prenatal counseling, and if her daughters were to consider enlarging their families they might want to have their spouse tested.   Candace Wells is very comfortable with this plan. She will discuss ovarian cancer screening versus surgery with Dr. Nori Riis. I also suggested she call our genetics counselor every September to see if there have been any updates in her particular situation.   I am not making a return appointment for her here but I will be glad to see her at any point in Candace future if Candace need arises.    [place] [facility]    08/01/2016  While we would still like to verify Candace actual pathogenic PALB2 mutation and Candace extent of genetic testing that Ms. Raybon has had, it seems like it is unlikely that further genetic testing is needed at this time.  We discussed that pathogenic PALB2 gene mutations cause an increased breast cancer risk for women (approximately 33-58% risk to age 65).    Candace [employer]  (NCCN) publishes recommendations for medical management for men and women who test positive for a pathogenic PALB2 gene mutation.  These recommendations are discussed below (v 1.2018):  Increased Breast Cancer Risk Beginning at age 10: annual mammogram and consider annual tomosynthesis and breast MRI with contrast Risk-reducing mastectomies: manage based on family history.  Ms. Gillum has already reduced her breast cancer risk through bilateral mastectomies.    Increased Pancreatic Cancer Risk Previous NCCN guidelines versions have listed a somewhat increased risk for pancreatic cancer.  Candace current version of guidelines lists unknown or insufficient evidence for this cancer risk.  Screening recommendations should be based  on family history.    There is no known family history of pancreatic cancer in Ms. Calandro's family, so it is unlikely that any specific recommendations should be made at this time.  Other Cancer Risks Unknown or insufficient evidence to suggest increased risk for ovarian, prostate, female breast, or other cancer risks.  Additional cancer risks may be determined as we learn more about Candace PALB2 gene.    A NOTE ABOUT FANCONI ANEMIA: Homozygous PALB2 mutation carriers (individuals with two PALB2 gene mutations) have a very rare condition called Fanconi Anemia, type N. Fanconi anemia type N increases Candace risk of several childhood cancers, including a rare form of kidney cancer called Wilms' tumor and a brain tumor called medulloblastoma. Additionally, people with Fanconi anemia experience bone marrow suppression, which causes an abnormal reduction in Candace number of red blood cells, white blood cells, and blood platelets made by Candace bone marrow. Candace reduced production of red blood cells causes Candace anemia characteristic of this disorder.  It is very unlikely that any of Ms. Gutkowski's family members are affected by this condition.  However, for a heterozygous carrier of Candace familial PALB2 mutation, if they are concerned about their  risks to pass Fanconi anemia onto their children, they can consider carrier testing for their partner.  A prenatal genetic counselor would also be a helpful resource to continue this discussion with interested relatives, and Candace best time to have this discussion is one is thinking about becoming pregnant.

## 2016-08-01 ENCOUNTER — Ambulatory Visit (HOSPITAL_BASED_OUTPATIENT_CLINIC_OR_DEPARTMENT_OTHER): Payer: Medicare Other | Admitting: Oncology

## 2016-08-01 VITALS — BP 107/49 | HR 89 | Temp 98.2°F | Resp 18 | Ht 64.0 in | Wt 129.5 lb

## 2016-08-01 DIAGNOSIS — M818 Other osteoporosis without current pathological fracture: Secondary | ICD-10-CM | POA: Diagnosis not present

## 2016-08-01 DIAGNOSIS — Z1589 Genetic susceptibility to other disease: Secondary | ICD-10-CM | POA: Insufficient documentation

## 2016-08-01 DIAGNOSIS — C50911 Malignant neoplasm of unspecified site of right female breast: Secondary | ICD-10-CM

## 2016-08-01 DIAGNOSIS — C50912 Malignant neoplasm of unspecified site of left female breast: Principal | ICD-10-CM

## 2016-08-01 DIAGNOSIS — Z853 Personal history of malignant neoplasm of breast: Secondary | ICD-10-CM

## 2016-08-01 DIAGNOSIS — Z1501 Genetic susceptibility to malignant neoplasm of breast: Secondary | ICD-10-CM

## 2016-08-01 DIAGNOSIS — Z1509 Genetic susceptibility to other malignant neoplasm: Secondary | ICD-10-CM

## 2017-01-24 DIAGNOSIS — M19049 Primary osteoarthritis, unspecified hand: Secondary | ICD-10-CM | POA: Diagnosis not present

## 2017-01-24 DIAGNOSIS — E559 Vitamin D deficiency, unspecified: Secondary | ICD-10-CM | POA: Diagnosis not present

## 2017-01-24 DIAGNOSIS — Z0000 Encounter for general adult medical examination without abnormal findings: Secondary | ICD-10-CM | POA: Diagnosis not present

## 2017-01-24 DIAGNOSIS — Z131 Encounter for screening for diabetes mellitus: Secondary | ICD-10-CM | POA: Diagnosis not present

## 2017-01-24 DIAGNOSIS — Z23 Encounter for immunization: Secondary | ICD-10-CM | POA: Diagnosis not present

## 2017-01-24 DIAGNOSIS — R69 Illness, unspecified: Secondary | ICD-10-CM | POA: Diagnosis not present

## 2017-02-06 ENCOUNTER — Encounter: Payer: Self-pay | Admitting: Internal Medicine

## 2017-02-07 ENCOUNTER — Encounter: Payer: Self-pay | Admitting: Internal Medicine

## 2017-02-07 ENCOUNTER — Ambulatory Visit (INDEPENDENT_AMBULATORY_CARE_PROVIDER_SITE_OTHER): Payer: Medicare HMO | Admitting: Internal Medicine

## 2017-02-07 VITALS — BP 106/68 | HR 74 | Ht 64.0 in | Wt 126.2 lb

## 2017-02-07 DIAGNOSIS — I480 Paroxysmal atrial fibrillation: Secondary | ICD-10-CM

## 2017-02-07 NOTE — Progress Notes (Signed)
PCP:  Melinda Crutch, MD Primary Cardiologist:  Dr Meda Coffee  The patient presents today for routine electrophysiology followup.  Since last being seen in our clinic, the patient reports doing very well.   she has been taking propranolol prn with improvements in sinus tachycardia.  Today, she denies symptoms of chest pain, shortness of breath, orthopnea, PND, lower extremity edema, dizziness, presyncope, syncope, or neurologic sequela.   The patient feels that she is tolerating medications without difficulties and is otherwise without complaint today.   Past Medical History:  Diagnosis Date  . atrial fib   . Breast cancer ([place]) 2010   left  . Osteoporosis   . Palpitations    Past Surgical History:  Procedure Laterality Date  . BREAST BIOPSY  02/28/2012   Procedure: BREAST BIOPSY WITH NEEDLE LOCALIZATION;  Surgeon: Rolm Bookbinder, MD;  Location: [place];  Service: General;  Laterality: Right;  Right breast wire guided biopsy  . BREAST RECONSTRUCTION  04/23/2012   Procedure: BREAST RECONSTRUCTION;  Surgeon: Crissie Reese, MD;  Location: [place];  Service: Plastics;  Laterality: Bilateral;  bilateral breast reconstruction with Tissue Expander placement  . BREAST SURGERY  2010   left mastectomy and snbx  . DILATION AND CURETTAGE OF UTERUS    . TONSILLECTOMY      Current Outpatient Prescriptions  Medication Sig Dispense Refill  . aspirin 81 MG tablet Take 81 mg by mouth daily.    Marland Kitchen b complex vitamins tablet Take 1 tablet by mouth daily.    . Calcium Carbonate-Vitamin D (CALCIUM + D PO) Take 1 tablet by mouth daily.     . Coenzyme Q10 (CO Q 10 PO) Take by mouth as directed.    . fexofenadine (ALLEGRA) 180 MG tablet Take 180 mg by mouth daily.    . fluticasone (FLONASE) 50 MCG/ACT nasal spray Place 2 sprays into both nostrils daily as needed for allergies.     . Multiple Vitamin (MULTIVITAMIN) capsule Take 1 capsule by mouth daily.    . propranolol (INDERAL)  20 MG tablet Take 10-20 mg by mouth 2 (two) times daily as needed (Tachycardia).    Marland Kitchen UNABLE TO FIND as directed. Cres Off    . Vitamin D, Cholecalciferol, 1000 UNITS CAPS Take 1 capsule by mouth daily.      No current facility-administered medications for this visit.     Allergies  Allergen Reactions  . Amitriptyline Palpitations and Other (See Comments)    And racing heart  . Sulfa Antibiotics Hives  . Tetracycline Hives    Patient thinks this is the name of the medication  . Tetracyclines & Related Hives  . Codeine Nausea And Vomiting    Social History   Social History  . Marital status: Married    Spouse name: N/A  . Number of children: 3  . Years of education: 12   Occupational History  . Not on file.   Social History Main Topics  . Smoking status: Never Smoker  . Smokeless tobacco: Never Used  . Alcohol use No  . Drug use: No  . Sexual activity: Not on file   Other Topics Concern  . Not on file   Social History Narrative   Patient is married with 3 children.   Patient is right handed.   Patient has hs education.   Patient drinks 2 times a week.    Family History  Problem Relation Age of Onset  . Breast cancer Mother 53    (  x3) breast primaries  . Hypertension Father   . Colon polyps Father 45    "cancerous" vs "precancerous colon polyp   . Hypertension Maternal Grandmother   . Heart disease Maternal Grandfather   . Heart Problems Maternal Grandfather     d. 60s  . Breast cancer Maternal Aunt 58    s/p lumpectomy and chemo; tested negative for familial PALB2 gene mutation  . Ulcers Paternal Uncle     d. 21 due to "bleeding ulcers"  . Heart Problems Paternal Grandmother     d. 27  . Other Daughter     PALB2 gene mutation  . Leukemia Other 3    niece  . Leukemia Cousin     maternal 1st cousin d. 4y  . Breast cancer Cousin     maternal 1st cousin dx. mid to late 86s  . Breast cancer Other     maternal great aunt (MGM's sister)  . Ovarian  cancer Other     maternal great aunt (MGM's sister), d. 57s  . Breast cancer Other     maternal great aunt (MGM's sister)  . Breast cancer Other     maternal great uncle (MGM's brother)  . Breast cancer Other     maternal great aunt (MGF's sister); d. young age  . Breast cancer Other     maternal great aunt (MGF's sister)  . Cancer Paternal Uncle     dx. laryngeal cancer in his 15s; smoker and radiation exposure from work on submarine; d. 15s    ROS-  All systems are reviewed and are negative except as outlined in the HPI above  Physical Exam: Vitals:   02/07/17 1417  BP: 106/68  Pulse: 74  SpO2: 97%  Weight: 126 lb 3.2 oz (57.2 kg)  Height: '5\' 4"'$  (1.626 m)    GEN- The patient is well appearing, alert and oriented x 3 today.   Head- normocephalic, atraumatic Eyes-  Sclera clear, conjunctiva pink Ears- hearing intact Oropharynx- clear Neck- supple,  Lungs- Clear to ausculation bilaterally, normal work of breathing Heart- Regular rate and rhythm, no murmurs, rubs or gallops, PMI not laterally displaced GI- soft, NT, ND, + BS Extremities- no clubbing, cyanosis, or edema MS- no significant deformity or atrophy Skin- no rash or lesion Psych- euthymic mood, full affect Neuro- strength and sensation are intact  Dr Phoebe Sharps notes are reviewed ekg today reveals sinus rhythm 74 bpm, incomplete RBBB, nonspecific ST/T changes  Assessment and Plan:  1. afib/ sinus tach Previously wore an event monitor which revealed that her palpitations were due to sinus rhythm.  She did not have afib on her prior monitor but has had short/ nonsustained atrial fibrillation only.   I am not convinced that she has an afib burden warranting further treatment.  She continues to decline anticoagulation.   Regular exercise and stress reduction is encouraged today  Return in 1 year She will contact me if problems arise in the interim.  Thompson Grayer MD, [facility] 02/07/2017 2:31 PM

## 2017-02-07 NOTE — Patient Instructions (Signed)

## 2017-02-20 DIAGNOSIS — Z6821 Body mass index (BMI) 21.0-21.9, adult: Secondary | ICD-10-CM | POA: Diagnosis not present

## 2017-02-20 DIAGNOSIS — Z124 Encounter for screening for malignant neoplasm of cervix: Secondary | ICD-10-CM | POA: Diagnosis not present

## 2017-02-20 DIAGNOSIS — Z01419 Encounter for gynecological examination (general) (routine) without abnormal findings: Secondary | ICD-10-CM | POA: Diagnosis not present

## 2017-03-14 DIAGNOSIS — L821 Other seborrheic keratosis: Secondary | ICD-10-CM | POA: Diagnosis not present

## 2017-03-14 DIAGNOSIS — L570 Actinic keratosis: Secondary | ICD-10-CM | POA: Diagnosis not present

## 2017-03-14 DIAGNOSIS — D229 Melanocytic nevi, unspecified: Secondary | ICD-10-CM | POA: Diagnosis not present

## 2017-03-27 DIAGNOSIS — R69 Illness, unspecified: Secondary | ICD-10-CM | POA: Diagnosis not present

## 2017-03-28 ENCOUNTER — Other Ambulatory Visit: Payer: Self-pay | Admitting: Family Medicine

## 2017-03-28 ENCOUNTER — Ambulatory Visit
Admission: RE | Admit: 2017-03-28 | Discharge: 2017-03-28 | Disposition: A | Discharge status: Home / Self Care | Payer: Medicare HMO | Source: Ambulatory Visit | Attending: Family Medicine | Admitting: Family Medicine

## 2017-03-28 DIAGNOSIS — M545 Low back pain, unspecified: Secondary | ICD-10-CM

## 2017-03-28 DIAGNOSIS — I700 Atherosclerosis of aorta: Secondary | ICD-10-CM | POA: Diagnosis not present

## 2017-03-28 DIAGNOSIS — Z23 Encounter for immunization: Secondary | ICD-10-CM | POA: Diagnosis not present

## 2017-03-28 DIAGNOSIS — M4310 Spondylolisthesis, site unspecified: Secondary | ICD-10-CM | POA: Diagnosis not present

## 2017-03-28 DIAGNOSIS — M549 Dorsalgia, unspecified: Secondary | ICD-10-CM | POA: Diagnosis not present

## 2017-03-28 DIAGNOSIS — M47816 Spondylosis without myelopathy or radiculopathy, lumbar region: Secondary | ICD-10-CM | POA: Diagnosis not present

## 2017-03-28 DIAGNOSIS — R69 Illness, unspecified: Secondary | ICD-10-CM | POA: Diagnosis not present

## 2017-04-03 ENCOUNTER — Ambulatory Visit: Payer: Medicare HMO | Attending: Family Medicine

## 2017-04-03 DIAGNOSIS — M545 Low back pain: Secondary | ICD-10-CM | POA: Insufficient documentation

## 2017-04-03 DIAGNOSIS — M25652 Stiffness of left hip, not elsewhere classified: Secondary | ICD-10-CM | POA: Insufficient documentation

## 2017-04-03 DIAGNOSIS — G8929 Other chronic pain: Secondary | ICD-10-CM | POA: Diagnosis present

## 2017-04-03 DIAGNOSIS — M25651 Stiffness of right hip, not elsewhere classified: Secondary | ICD-10-CM

## 2017-04-03 NOTE — Therapy (Signed)
[facility] [address], [place], [place], [address] Phone: [phone]   Fax:  [phone]  Physical Therapy Evaluation  Patient Details  Name: Candace Wells MRN: [id number] Date of Birth: 1950-10-19 Referring Provider: Melinda Crutch, MD  Encounter Date: 04/03/2017      PT End of Session - 04/03/17 1220    Visit Number 1   Number of Visits 10   Date for PT Re-Evaluation 05/29/17   PT Start Time 5638   PT Stop Time 1220   PT Time Calculation (min) 35 min   Activity Tolerance Patient tolerated treatment well   Behavior During Therapy [facility] for tasks assessed/performed      Past Medical History:  Diagnosis Date  . atrial fib   . Breast cancer ([place]) 2010   left  . Osteoporosis   . Palpitations     Past Surgical History:  Procedure Laterality Date  . BREAST BIOPSY  02/28/2012   Procedure: BREAST BIOPSY WITH NEEDLE LOCALIZATION;  Surgeon: Rolm Bookbinder, MD;  Location: [place];  Service: General;  Laterality: Right;  Right breast wire guided biopsy  . BREAST RECONSTRUCTION  04/23/2012   Procedure: BREAST RECONSTRUCTION;  Surgeon: Crissie Reese, MD;  Location: [place];  Service: Plastics;  Laterality: Bilateral;  bilateral breast reconstruction with Tissue Expander placement  . BREAST SURGERY  2010   left mastectomy and snbx  . DILATION AND CURETTAGE OF UTERUS    . TONSILLECTOMY      There were no vitals filed for this visit.       Subjective Assessment - 04/03/17 1151    Subjective Pt presents to PT with complaints of LBP lasting 2 months.  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     Pertinent History history of breast cancer x 2 (2010 and 2013), osteoporosis   Diagnostic tests x-ray: lumbar DDD   Patient Stated Goals reduce pain in low back, stand without pain   Currently in Pain? 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  No   Is the patient reluctant to leave their home because of a fear of falling?  No     Home Environment   Living Environment Private residence   Type of Home House     Prior Function   Level of [place]  Part time employment   Vocation Requirements hair stylist-standing at work   Leisure none     Cognition   Overall Cognitive Status Within [place] for tasks assessed     Observation/Other Assessments   Focus on Therapeutic Outcomes (FOTO)  43% limitation     Posture/Postural Control   Posture/Postural Control Postural limitations     ROM / Strength   AROM / PROM / Strength AROM;PROM;Strength     AROM   Overall AROM  Within functional limits for tasks performed   Overall AROM Comments full lumbar A/ROM with mild pain reported at end range of each     PROM   Overall PROM  Deficits   Overall PROM Comments hip ER limited by 25%, hamstring flexibility limited by 25%     Strength   Overall Strength Within functional limits for tasks performed   Overall Strength Comments 4+/5 bil LE strength     Palpation    Spinal mobility PA mobility limited by 20%   Palpation comment Pt with mild palpable tenderness over bil lumbar paraspinals and Rt gluteals     Ambulation/Gait   Ambulation/Gait Yes   Ambulation/Gait Assistance 7: Independent            Objective measurements completed on examination: See above findings.                  PT Education - 04/03/17 1214    Education provided Yes   Education Details hip flexibility, TA activation   Person(s) Educated Patient   Methods Demonstration;Explanation;Handout   Comprehension Verbalized understanding;Returned demonstration          PT Short Term Goals - 04/03/17 1228      PT SHORT TERM GOAL #1   Title be independent in initial HEP   Time 4   Period Weeks   Status New     PT SHORT TERM GOAL #2   Title verbalize and demonstrate body mechanics modifications for home and work to protect spine   Time 4   Period Weeks   Status New     PT SHORT TERM GOAL #3   Title report a 30% reduction in LBP with standing at work   Time 4   Period Weeks   Status New           PT Long Term Goals - 04/03/17 1144      PT LONG TERM GOAL #1   Title be independent in advanced HEP   Time 8   Period Weeks   Status New     PT LONG TERM GOAL #2   Title reduce FOTO to < or = to 30% limitation   Time 8   Period Weeks   Status New     PT LONG TERM GOAL #3   Title report a 60% reduction in LBP with standing for work   Time 8   Period Weeks   Status New     PT LONG TERM GOAL #4   Title report < or = to 2/10 LBP with vacuuming   Time 8   Period Weeks   Status New                Plan - 04/03/17 1224    Clinical Impression Statement Pt presents to PT with compliants of bil low back pain lasting 2 months.  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  Pt reports 5/10 LBP with standing for work, vacuuming and yardwork.  Pt  with hip stiffness bilaterally and painful lumbar mobility.  Pt will benefit from skilled PT  for body mechanics training for lumbar spine and osteoporosis, hip flexibility, core strength, manual and modalities to reduce pain.     History and Personal Factors relevant to plan of care: osteoporosis, breast cancer   Clinical Presentation Stable   Clinical Presentation due to: stable condition with 5/10 LBP over the past 2 months   Clinical Decision Making Low   Rehab Potential Good   PT Frequency 2x / week   PT Duration 8 weeks   PT Treatment/Interventions ADLs/Self Care Home Management;Cryotherapy;Electrical Stimulation;Moist Heat;Patient/family education;Therapeutic exercise;Therapeutic activities;Manual techniques;Passive range of motion;Taping   PT Next Visit Plan body mechanics education for LBP and osteo, TA progression, manual and modalities, review HEP   Consulted and Agree with Plan of Care Patient      Patient will benefit from skilled therapeutic intervention in order to improve the following deficits and impairments:  Decreased strength, Impaired flexibility, Improper body mechanics, Pain, Decreased activity tolerance, Increased muscle spasms  Visit Diagnosis: Chronic bilateral low back pain without sciatica - Plan: PT plan of care cert/re-cert  Stiffness of left hip, not elsewhere classified - Plan: PT plan of care cert/re-cert  Stiffness of right hip, not elsewhere classified - Plan: PT plan of care cert/re-cert      G-Codes - 68/25/74 1143    Functional Assessment Tool Used (Outpatient Only) FOTO: 43% limitation   Functional Limitation Other PT primary   Other PT Primary Current Status (V3552) At least 40 percent but less than 60 percent impaired, limited or restricted   Other PT Primary Goal Status (Z7471) At least 20 percent but less than 40 percent impaired, limited or restricted       Problem List Patient Active Problem List   Diagnosis Date Noted  . Monoallelic mutation of PALB2 gene 08/01/2016  . HLD (hyperlipidemia) 09/16/2014  . TIA (transient  ischemic attack) 08/02/2014  . Paresthesia of arm 08/01/2014  . Paroxysmal atrial fibrillation ([place]) 10/14/2013  . Palpitations 10/14/2013  . Anxiety 07/31/2012  . Breast cancer, female [facility]) 11/21/2011    Sigurd Sos, PT 04/03/17 12:33 PM  [facility] [address] [place], [place], [address] Phone: [phone]   Fax:  [phone]  Name: Candace Wells MRN: [id number] Date of Birth: 12-13-1950

## 2017-04-03 NOTE — Patient Instructions (Addendum)
Perform all exercises below:  Hold _20___ seconds. Repeat _3___ times.  Do __3__ sessions per day. CAUTION: Movement should be gentle, steady and slow.  KEEP YOUR BACK STRAIGHT-BEND AT HIPS, NOT YOUR BACK!  Lumbar Rotation: Caudal - Bilateral (Supine)  Feet and knees together, arms outstretched, rotate knees left, turning head in opposite direction, until stretch is felt.    Piriformis Stretch, Sitting    Sit, one ankle on opposite knee, same-side hand on crossed knee. Push down on knee, keeping spine straight. Lean torso forward, with flat back, until tension is felt in hamstrings and gluteals of crossed-leg side. Hold _20__ seconds.  Repeat _3__ times per session. Do _3__ sessions per day.  Copyright  VHI. All rights reserved.    HIP: Hamstrings - Short Sitting   Rest leg on raised surface. Keep knee straight. Lift chest.   Lower abdominal/core stability exercises  1. Practice your breathing technique: Inhale through your nose expanding your belly and rib cage. Try not to breathe into your chest. Exhale slowly and gradually out your mouth feeling a sense of softness to your body. Practice multiple times. This can be performed unlimited.  2. Finding the lower abdominals. Laying on your back with the knees bent, place your fingers just below your belly button. Using your breathing technique from above, on your exhale gently pull the belly button away from your fingertips without tensing any other muscles. Practice this 5x. Next, as you exhale, draw belly button inwards and hold onto it...then feel as if you are pulling that muscle across your pelvis like you are tightening a belt. This can be hard to do at first so be patient and practice. Do 5-10 reps 1-3 x day. Always recognize quality over quantity; if your abdominal muscles become tired you will notice you may tighten/contract other muscles. This is the time to take a break.   Practice this first laying on your back, then in  sitting, progressing to standing and finally adding it to all your daily movements.      [place] [address], [place], [place] [address] Phone # [phone] Fax [phone]

## 2017-04-13 ENCOUNTER — Encounter (HOSPITAL_COMMUNITY): Payer: Self-pay

## 2017-04-18 ENCOUNTER — Ambulatory Visit: Payer: Medicare HMO | Attending: Family Medicine | Admitting: Rehabilitative and Restorative Service Providers"

## 2017-04-18 DIAGNOSIS — M25651 Stiffness of right hip, not elsewhere classified: Secondary | ICD-10-CM | POA: Diagnosis not present

## 2017-04-18 DIAGNOSIS — M25652 Stiffness of left hip, not elsewhere classified: Secondary | ICD-10-CM | POA: Diagnosis not present

## 2017-04-18 DIAGNOSIS — M545 Low back pain: Secondary | ICD-10-CM | POA: Diagnosis not present

## 2017-04-18 DIAGNOSIS — G8929 Other chronic pain: Secondary | ICD-10-CM | POA: Diagnosis not present

## 2017-04-18 NOTE — Therapy (Signed)
[facility] [address] [place], [place], [address] Phone: [phone]   Fax:  [phone]  Physical Therapy Treatment  Patient Details  Name: Candace Wells MRN: [id number] Date of Birth: 1950-11-14 Referring Provider: Melinda Crutch, MD  Encounter Date: 04/18/2017      PT End of Session - 04/18/17 0841    Visit Number 2   Number of Visits 10   Date for PT Re-Evaluation 05/29/17   PT Start Time 0802   PT Stop Time 0844   PT Time Calculation (min) 42 min   Activity Tolerance Patient tolerated treatment well   Behavior During Therapy [facility] for tasks assessed/performed      Past Medical History:  Diagnosis Date  . atrial fib   . Breast cancer ([place]) 2010   left  . Osteoporosis   . Palpitations     Past Surgical History:  Procedure Laterality Date  . BREAST BIOPSY  02/28/2012   Procedure: BREAST BIOPSY WITH NEEDLE LOCALIZATION;  Surgeon: Rolm Bookbinder, MD;  Location: [facility];  Service: General;  Laterality: Right;  Right breast wire guided biopsy  . BREAST RECONSTRUCTION  04/23/2012   Procedure: BREAST RECONSTRUCTION;  Surgeon: Crissie Reese, MD;  Location: [facility];  Service: Plastics;  Laterality: Bilateral;  bilateral breast reconstruction with Tissue Expander placement  . BREAST SURGERY  2010   left mastectomy and snbx  . DILATION AND CURETTAGE OF UTERUS    . TONSILLECTOMY      There were no vitals filed for this visit.      Subjective Assessment - 04/18/17 0805    Subjective I was at the beach all last week. My back/hips bothered me after the week with the traveling but overall was OK. No real pain right now.    Pertinent History history of breast cancer x 2 (2010 and 2013), osteoporosis   How long can you stand comfortably? depends on the shoes; 5 min to 1 hour   Diagnostic tests x-ray: lumbar DDD   Currently in Pain? No/denies   Pain Location Back   Pain  Orientation Right;Left   Pain Frequency Occasional   Aggravating Factors  working, standing in the wrong shoes    Pain Relieving Factors Aleve, resting   Multiple Pain Sites No                         [facility] Adult PT Treatment/Exercise - 04/18/17 0001      Lumbar Exercises: Stretches   Piriformis Stretch Limitations seated Piriformis stretch 2x30 sec without pushing leg down for extra stretch  (pushing leg down bothers hip)     Lumbar Exercises: Standing   Other Standing Lumbar Exercises tilt with glute set with unilat hip ext x 15 bil; tilt with squat x 10 with max verbal cues for technique; prayer stretch 2x15 sec;     Lumbar Exercises: Supine   Other Supine Lumbar Exercises knee to opposite shoulder stretch 3x30 sec, knee to opposite shoulder stretch with contralateral hip flexor stretch 3x30 sec; pelvic tilt x 20 with mod verbal and tactile cues for technique; tilt with march x 20; tilt with glute set and clam shell x 20; tilt with LE decompression x 15 with mod verbal cues for technique; tilt with bil UE flex/ext x 20; tilt with opposite UE/LE lifts x 20; knee to opposite shoulder stretch x 30 sec bil;      Lumbar Exercises: Sidelying  Other Sidelying Lumbar Exercises hip flexor stretch 2x30 sec                PT Education - 04/18/17 0818    Education provided Yes   Education Details discussed various shoes for extra support (NAOT, ECCOs)   Person(s) Educated Patient   Methods Explanation   Comprehension Verbalized understanding          PT Short Term Goals - 04/18/17 0823      PT SHORT TERM GOAL #1   Title be independent in initial HEP   Time 4   Period Weeks   Status On-going     PT SHORT TERM GOAL #2   Title verbalize and demonstrate body mechanics modifications for home and work to protect spine   Time 4   Period Weeks   Status On-going     PT SHORT TERM GOAL #3   Title report a 30% reduction in LBP with standing at work   Time 4    Period Weeks   Status On-going           PT Long Term Goals - 04/18/17 0824      PT LONG TERM GOAL #1   Title be independent in advanced HEP   Time 8   Period Weeks   Status On-going     PT LONG TERM GOAL #2   Title reduce FOTO to < or = to 30% limitation   Time 8   Period Weeks   Status On-going     PT LONG TERM GOAL #3   Title report a 60% reduction in LBP with standing for work   Time 8   Period Weeks   Status On-going     PT LONG TERM GOAL #4   Title report < or = to 2/10 LBP with vacuuming   Time 8   Period Weeks   Status On-going               Plan - 04/18/17 0818    Clinical Impression Statement Pt presents to PT with c/o LBP and bil hip flexor inflexibility especially of bil hip flexors L > R. Pt would benefit from further PT for bil hip flexibility with concentration on hip flexor flexibility, lumbar/core strengthening, manual therapy and modalities as needed.   History and Personal Factors relevant to plan of care: osteoporosis, breast Cancer   Rehab Potential Good   PT Frequency 2x / week   PT Duration 8 weeks   PT Treatment/Interventions ADLs/Self Care Home Management;Cryotherapy;Electrical Stimulation;Moist Heat;Patient/family education;Therapeutic exercise;Therapeutic activities;Manual techniques;Passive range of motion;Taping   PT Next Visit Plan body mechanics education, continue to progress pelvic tilt exercise.   Consulted and Agree with Plan of Care Patient      Patient will benefit from skilled therapeutic intervention in order to improve the following deficits and impairments:  Decreased strength, Impaired flexibility, Improper body mechanics, Pain, Decreased activity tolerance, Increased muscle spasms  Visit Diagnosis: Chronic bilateral low back pain without sciatica  Stiffness of left hip, not elsewhere classified  Stiffness of right hip, not elsewhere classified     Problem List Patient Active Problem List   Diagnosis Date  Noted  . Monoallelic mutation of PALB2 gene 08/01/2016  . HLD (hyperlipidemia) 09/16/2014  . TIA (transient ischemic attack) 08/02/2014  . Paresthesia of arm 08/01/2014  . Paroxysmal atrial fibrillation ([place]) 10/14/2013  . Palpitations 10/14/2013  . Anxiety 07/31/2012  . Breast cancer, female ([place]) 11/21/2011    Kenzy Campoverde, PT 04/18/2017,  8:45 AM  [facility] [address], [place], [place], [address] Phone: [phone]   Fax:  [phone]  Name: Candace Wells MRN: [id number] Date of Birth: 06-07-1951

## 2017-04-25 ENCOUNTER — Ambulatory Visit: Payer: Medicare HMO

## 2017-04-25 DIAGNOSIS — M25651 Stiffness of right hip, not elsewhere classified: Secondary | ICD-10-CM

## 2017-04-25 DIAGNOSIS — M545 Low back pain, unspecified: Secondary | ICD-10-CM

## 2017-04-25 DIAGNOSIS — M25652 Stiffness of left hip, not elsewhere classified: Secondary | ICD-10-CM | POA: Diagnosis not present

## 2017-04-25 DIAGNOSIS — G8929 Other chronic pain: Secondary | ICD-10-CM | POA: Diagnosis not present

## 2017-04-25 NOTE — Therapy (Addendum)
[facility] [address], [place], [place], [address] Phone: [phone]   Fax:  [phone]  Physical Therapy Treatment  Patient Details  Name: Candace Wells MRN: [id number] Date of Birth: 11-06-1950 Referring Provider: Melinda Crutch, MD  Encounter Date: 04/25/2017      PT End of Session - 04/25/17 0810    Visit Number 3   Number of Visits 10   Date for PT Re-Evaluation 05/29/17   PT Start Time 0803   PT Stop Time 0846   PT Time Calculation (min) 43 min   Activity Tolerance Patient tolerated treatment well   Behavior During Therapy [facility] for tasks assessed/performed      Past Medical History:  Diagnosis Date  . atrial fib   . Breast cancer ([place]) 2010   left  . Osteoporosis   . Palpitations     Past Surgical History:  Procedure Laterality Date  . BREAST BIOPSY  02/28/2012   Procedure: BREAST BIOPSY WITH NEEDLE LOCALIZATION;  Surgeon: Rolm Bookbinder, MD;  Location: [place];  Service: General;  Laterality: Right;  Right breast wire guided biopsy  . BREAST RECONSTRUCTION  04/23/2012   Procedure: BREAST RECONSTRUCTION;  Surgeon: Crissie Reese, MD;  Location: [place];  Service: Plastics;  Laterality: Bilateral;  bilateral breast reconstruction with Tissue Expander placement  . BREAST SURGERY  2010   left mastectomy and snbx  . DILATION AND CURETTAGE OF UTERUS    . TONSILLECTOMY      There were no vitals filed for this visit.      Subjective Assessment - 04/25/17 0808    Subjective Pt. noting partial adherence to HEP.     Patient Stated Goals reduce pain in low back, stand without pain   Currently in Pain? Yes   Pain Score 2    Pain Location Back   Pain Orientation Right;Left   Pain Descriptors / Indicators Aching;Sore   Pain Type Chronic pain   Pain Onset More than a month ago   Aggravating Factors  Prolonged standing,    Pain Relieving Factors rest   Multiple  Pain Sites No                         [facility] Adult PT Treatment/Exercise - 04/25/17 0830      Lumbar Exercises: Standing   Forward Lunge 10 reps;3 seconds   Forward Lunge Limitations with 1 UE support on chair   Other Standing Lumbar Exercises B hip flexor stretch lunging knee on chair x 30 sec each way     Lumbar Exercises: Supine   Ab Set 15 reps;5 seconds   AB Set Limitations tc to ensure activation   Bridge 15 reps;3 seconds   Bridge Limitations with sustained hip abd/ER with red TB      Lumbar Exercises: Sidelying   Clam 15 reps;3 seconds   Clam Limitations with red looped TB around knees and abdom. bracing     Knee/Hip Exercises: Stretches   Sports administrator Right;Left;2 reps;30 seconds   Quad Stretch Limitations prone with strap   Hip Flexor Stretch Right;Left;2 reps;30 seconds   Hip Flexor Stretch Limitations in mod thomas with abdom. activation, and strap   Piriformis Stretch 2 reps;Right;Left;30 seconds   Piriformis Stretch Limitations KTOS      Knee/Hip Exercises: Seated   Sit to Sand 10 reps;without UE support  on airex pad without 3" eccentric  PT Education - 04/25/17 0904    Education provided Yes   Education Details Hip flexor stretch on chair, supine mod thomas hip flexor stretch   Person(s) Educated Patient   Methods Explanation;Demonstration;Verbal cues;Handout   Comprehension Verbalized understanding;Returned demonstration;Verbal cues required;Need further instruction          PT Short Term Goals - 04/18/17 1220      PT SHORT TERM GOAL #1   Title be independent in initial HEP   Time 4   Period Weeks   Status On-going     PT SHORT TERM GOAL #2   Title verbalize and demonstrate body mechanics modifications for home and work to protect spine   Time 4   Period Weeks   Status On-going     PT SHORT TERM GOAL #3   Title report a 30% reduction in LBP with standing at work   Time 4   Period Weeks   Status  On-going           PT Long Term Goals - 04/18/17 0824      PT LONG TERM GOAL #1   Title be independent in advanced HEP   Time 8   Period Weeks   Status On-going     PT LONG TERM GOAL #2   Title reduce FOTO to < or = to 30% limitation   Time 8   Period Weeks   Status On-going     PT LONG TERM GOAL #3   Title report a 60% reduction in LBP with standing for work   Time 8   Period Weeks   Status On-going     PT LONG TERM GOAL #4   Title report < or = to 2/10 LBP with vacuuming   Time 8   Period Weeks   Status On-going               Plan - 04/25/17 0900    Clinical Impression Statement Pt. seen to start treatment with low-level LBP, which decreased throughout therex.  Focus of today's treatment was progression of lumbopelvic strengthening and hip flexor active and passive stretching.  HEP updated to include additional hip flexor stretches.  Pt. noting only activity that significantly increases LBP at this point is prolonged standing with job.  Will plan to discuss body mechanics and progress therex per pt. tolerance in coming visits.   PT Treatment/Interventions ADLs/Self Care Home Management;Cryotherapy;Electrical Stimulation;Moist Heat;Patient/family education;Therapeutic exercise;Therapeutic activities;Manual techniques;Passive range of motion;Taping   PT Next Visit Plan Body mechanics education, continue to progress pelvic tilt exercise       Patient will benefit from skilled therapeutic intervention in order to improve the following deficits and impairments:  Decreased strength, Impaired flexibility, Improper body mechanics, Pain, Decreased activity tolerance, Increased muscle spasms  Visit Diagnosis: Chronic bilateral low back pain without sciatica  Stiffness of left hip, not elsewhere classified  Stiffness of right hip, not elsewhere classified     Problem List Patient Active Problem List   Diagnosis Date Noted  . Monoallelic mutation of PALB2 gene  08/01/2016  . HLD (hyperlipidemia) 09/16/2014  . TIA (transient ischemic attack) 08/02/2014  . Paresthesia of arm 08/01/2014  . Paroxysmal atrial fibrillation ([facility]) 10/14/2013  . Palpitations 10/14/2013  . Anxiety 07/31/2012  . Breast cancer, female ([facility]) 11/21/2011    Kermit Balo, PTA 04/25/17 9:11 AM PHYSICAL THERAPY DISCHARGE SUMMARY  Visits from Start of Care: 3  Current functional level related to goals / functional outcomes: See above for current status.  Pt didn't return to PT   Remaining deficits: See above. G-codes: Other PT primary Goal status: CJ D/C status: CK  Education / Equipment: HEP Plan: Patient agrees to discharge.  Patient goals were partially met. Patient is being discharged due to not returning since the last visit.  ?????        Sigurd Sos, PT 05/25/17 8:46 AM  [facility] [address], [place], [place], [address] Phone: [phone]   Fax:  [phone]  Name: Candace Wells MRN: [id number] Date of Birth: October 18, 1950

## 2017-05-02 ENCOUNTER — Ambulatory Visit: Payer: Medicare HMO | Admitting: Rehabilitative and Restorative Service Providers"

## 2017-05-22 ENCOUNTER — Other Ambulatory Visit: Payer: Self-pay | Admitting: Dermatology

## 2017-05-22 DIAGNOSIS — D492 Neoplasm of unspecified behavior of bone, soft tissue, and skin: Secondary | ICD-10-CM | POA: Diagnosis not present

## 2017-05-22 DIAGNOSIS — L820 Inflamed seborrheic keratosis: Secondary | ICD-10-CM | POA: Diagnosis not present

## 2017-11-07 DIAGNOSIS — R69 Illness, unspecified: Secondary | ICD-10-CM | POA: Diagnosis not present

## 2017-12-04 ENCOUNTER — Telehealth: Payer: Self-pay | Admitting: *Deleted

## 2017-12-04 ENCOUNTER — Telehealth: Payer: Self-pay | Admitting: Medical Oncology

## 2017-12-04 NOTE — Telephone Encounter (Signed)
"  Nerve ending pain under left side mastectomy site w swelling. ". For past 2 1/2 weeks this has been going on. She is scared and wants to talk to Dr Starleen Arms nurse. Last visit was 2 years ago.

## 2017-12-04 NOTE — Telephone Encounter (Signed)
This RN spoke with pt per call - she states area of concern on the left has " like more skin left from the reconstruction - which is where this discomfort is."  She states pain is " like pins and needles just prickly "  She denies any swelling " but it feels tight " nor reddnes.  Area is not tender to touch.  There are no lumps or nodules.  " just that this feeling came up sorta suddenly and all I can think is it is the cancer recurring "  Candace Wells has looked at the area in a mirror and no noted rashes.  She states she recently had the shingles vaccine.  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  Arms are not swollen or affected by the above.  Plan per above is for pt to come in for visit ( which is what she really wanted to do) " just need Dr Jana Hakim to look at this and tell me what to do "

## 2017-12-04 NOTE — Telephone Encounter (Signed)
This RN returned call and obtained identified VM.  Message left requesting a return call to this RN for further discussion.

## 2017-12-05 ENCOUNTER — Encounter: Payer: Self-pay | Admitting: Oncology

## 2017-12-05 ENCOUNTER — Inpatient Hospital Stay: Payer: Medicare HMO | Attending: Oncology | Admitting: Oncology

## 2017-12-05 ENCOUNTER — Telehealth: Payer: Self-pay

## 2017-12-05 VITALS — BP 120/75 | HR 105 | Temp 98.4°F | Resp 18 | Ht 64.0 in | Wt 129.9 lb

## 2017-12-05 DIAGNOSIS — Z1501 Genetic susceptibility to malignant neoplasm of breast: Secondary | ICD-10-CM

## 2017-12-05 DIAGNOSIS — I4891 Unspecified atrial fibrillation: Secondary | ICD-10-CM

## 2017-12-05 DIAGNOSIS — Z9882 Breast implant status: Secondary | ICD-10-CM | POA: Diagnosis not present

## 2017-12-05 DIAGNOSIS — R002 Palpitations: Secondary | ICD-10-CM | POA: Insufficient documentation

## 2017-12-05 DIAGNOSIS — Z9013 Acquired absence of bilateral breasts and nipples: Secondary | ICD-10-CM | POA: Diagnosis not present

## 2017-12-05 DIAGNOSIS — C50112 Malignant neoplasm of central portion of left female breast: Secondary | ICD-10-CM

## 2017-12-05 DIAGNOSIS — M810 Age-related osteoporosis without current pathological fracture: Secondary | ICD-10-CM | POA: Diagnosis not present

## 2017-12-05 DIAGNOSIS — Z1589 Genetic susceptibility to other disease: Secondary | ICD-10-CM

## 2017-12-05 DIAGNOSIS — Z800 Family history of malignant neoplasm of digestive organs: Secondary | ICD-10-CM

## 2017-12-05 DIAGNOSIS — I700 Atherosclerosis of aorta: Secondary | ICD-10-CM | POA: Insufficient documentation

## 2017-12-05 DIAGNOSIS — Z7982 Long term (current) use of aspirin: Secondary | ICD-10-CM

## 2017-12-05 DIAGNOSIS — Z8041 Family history of malignant neoplasm of ovary: Secondary | ICD-10-CM | POA: Diagnosis not present

## 2017-12-05 DIAGNOSIS — M79622 Pain in left upper arm: Secondary | ICD-10-CM | POA: Insufficient documentation

## 2017-12-05 DIAGNOSIS — Z853 Personal history of malignant neoplasm of breast: Secondary | ICD-10-CM

## 2017-12-05 DIAGNOSIS — Z9223 Personal history of estrogen therapy: Secondary | ICD-10-CM

## 2017-12-05 DIAGNOSIS — Z8042 Family history of malignant neoplasm of prostate: Secondary | ICD-10-CM

## 2017-12-05 DIAGNOSIS — I480 Paroxysmal atrial fibrillation: Secondary | ICD-10-CM

## 2017-12-05 DIAGNOSIS — Z803 Family history of malignant neoplasm of breast: Secondary | ICD-10-CM | POA: Diagnosis not present

## 2017-12-05 DIAGNOSIS — Z170 Estrogen receptor positive status [ER+]: Secondary | ICD-10-CM

## 2017-12-05 DIAGNOSIS — Z79899 Other long term (current) drug therapy: Secondary | ICD-10-CM

## 2017-12-05 DIAGNOSIS — Z1509 Genetic susceptibility to other malignant neoplasm: Secondary | ICD-10-CM

## 2017-12-05 NOTE — Progress Notes (Signed)
ID: Candace Wells   DOB: 12-Sep-1951  MR#: [id number]  DIY#:[id number]  PCP: Candace Cruel, MD GYN: Candace Wells SU: Candace Wells OTHER MD: Candace Wells, Candace Wells, [facility] [place]]  CHIEF COMPLAINT: PALB2 positivity  CURRENT TREATMENT: Observation   INTERVAL HISTORY: Candace Wells return today for follow up of her breast cancers and PALB2 mutation. She is on observation alone. She note that she developed pain in the left underarm and side area 2-3 weeks ago. She notes that she feels breast twinges every now and again. She notes that she has stopped wearing a bra over the last week due to this.  Since her last visit, she completed a lumbar spine x-ray on 03/28/2018 showing: Pars defects at L5 bilaterally with 12 mm of anterolisthesis of L5 on S1, not appreciably changed from prior study. There is facet osteoarthritic change at L4-5 and L5-S1 bilaterally, more severe at L5-S1. No new spondylolisthesis. No fracture. No blastic or lytic bone lesions are evident. There is mild scoliosis. There is mild aortic atherosclerosis.   REVIEW OF SYSTEMS: Candace Wells reports that she had breast reconstruction under Dr. Harlow Wells first and second under Dr. Harle Wells. She notes that the right side has no fat, but the left side does. She notes that the left side is where she feels most of her pain. She notes that she stays active at work, she she walks. She notes that she still has heart palpitations at night and she still takes Xanax for that.  She does not take propranolol.. She denies unusual headaches, visual changes, nausea, vomiting, or dizziness. There has been no unusual cough, phlegm production, or pleurisy. This been no change in bowel or bladder habits. She denies unexplained fatigue or unexplained weight loss, bleeding, rash, or fever. A detailed review of systems was otherwise stable.     HISTORY OF BREAST CANCER:  From the original intake note:  Candace Wells had "busy breasts" and has had multiple biopsies in both  breasts, primarily the left breast, where biopsies in February of 2001 and August of 2001 showed ADH.  Most recently, she palpated a mass in her left breast which showed it to be quite dense.  The patient reported a palpable mass in the 12 o'clock position, approximately 1-2 cm superior to the nipple, and this was confirmed on exam by Dr. Glennon Wells.  Diagnostic mammography on March 31st showed again very stable breasts with no areas of distortion or microcalcifications.  Mammograms were negative.  However, the ultrasound showed hypoechoic solid mass in the area in question measuring up to 1 cm.  There was no shadowing associated with this.  Biopsy was obtained on April 5th under ultrasound guidance, and this showed ([id number]) an invasive breast cancer which was ER "positive" at 5%, PR negative at 0% with a proliferation marker of 49%, and HER-2 negative by CISH with a ratio of 1.26.    With this information, the patient was referred to Candace Wells and given the fact that she had had multiple breast biopsies previously, the patient opted for left mastectomy with sentinel lymph node sampling.  Breast MRI showed only the mass in question and it measured 3.2 cm by MRI. The definitive surgery was performed on April 21st and showed ([id number]) an invasive ductal carcinoma, grade 3; measuring between 1.5 and 2.0 cm, with 0 of 3 sentinel lymph nodes involved.  There was no lymphovascular invasion and margins were ample.  Her subsequent history is as detailed below   PAST MEDICAL HISTORY: Past  Medical History:  Diagnosis Date  . atrial fib   . Breast cancer ([place]) 2010   left  . Osteoporosis   . Palpitations     PAST SURGICAL HISTORY: Past Surgical History:  Procedure Laterality Date  . BREAST BIOPSY  02/28/2012   Procedure: BREAST BIOPSY WITH NEEDLE LOCALIZATION;  Surgeon: Candace Bookbinder, MD;  Location: [place];  Service: General;  Laterality: Right;  Right breast wire  guided biopsy  . BREAST RECONSTRUCTION  04/23/2012   Procedure: BREAST RECONSTRUCTION;  Surgeon: Candace Reese, MD;  Location: [place];  Service: Plastics;  Laterality: Bilateral;  bilateral breast reconstruction with Tissue Expander placement  . BREAST SURGERY  2010   left mastectomy and snbx  . DILATION AND CURETTAGE OF UTERUS    . TONSILLECTOMY      FAMILY HISTORY Family History  Problem Relation Age of Onset  . Breast cancer Mother 36       (x3) breast primaries  . Hypertension Father   . Colon polyps Father 35       "cancerous" vs "precancerous colon polyp   . Hypertension Maternal Grandmother   . Heart disease Maternal Grandfather   . Heart Problems Maternal Grandfather        d. 80s  . Breast cancer Maternal Aunt 58       s/p lumpectomy and chemo; tested negative for familial PALB2 gene mutation  . Ulcers Paternal Uncle        d. 21 due to "bleeding ulcers"  . Heart Problems Paternal Grandmother        d. 53  . Other Daughter        PALB2 gene mutation  . Leukemia Other 3       niece  . Leukemia Cousin        maternal 1st cousin d. 4y  . Breast cancer Cousin        maternal 1st cousin dx. mid to late 63s  . Breast cancer Other        maternal great aunt (MGM's sister)  . Ovarian cancer Other        maternal great aunt (MGM's sister), d. 36s  . Breast cancer Other        maternal great aunt (MGM's sister)  . Breast cancer Other        maternal great uncle (MGM's brother)  . Breast cancer Other        maternal great aunt (MGF's sister); d. young age  . Breast cancer Other        maternal great aunt (MGF's sister)  . Cancer Paternal Uncle        dx. laryngeal cancer in his 49s; smoker and radiation exposure from work on submarine; d. 43s  The patient's father is alive at age 19.  Her mother was diagnosed with breast cancer at age 87 and died of a bone marrow transplant for breast cancer at age 26.  The patient has one sister and one brother, both  alive and in good health.  There is considerable breast cancer history on the patient's mother's side. The patient's mother's sister had breast cancer.  The patient's maternal grandmother and three of her sisters and her brother all had breast cancer according to the patient.  Nevertheless, the patient has been tested for BRCA-1 and she tells me she has been found to be negative.  GYNECOLOGIC HISTORY: The patient is GX P3, first pregnancy to term at age 57.  She went  through the change of life about 2004.  She did not take hormone replacement.    SOCIAL HISTORY: She works as a Theme park manager.  Her husband of >40 years, Erline Hau "Ronalee Belts" - is working currently as a Optometrist and is semiretired.  The patient's daughter Candace Wells has four children and is married to Candace Wells, one of our ER physicians. They live in [place].  Daughter Candace Wells is getting her PhD at [employer].  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  She is divorced and has a son.  Daughter Candace Wells lives in [place] and works there as a Theme park manager.  The patient is a [employer].      ADVANCED DIRECTIVES: Not in place  HEALTH MAINTENANCE: Social History   Tobacco Use  . Smoking status: Never Smoker  . Smokeless tobacco: Never Used  Substance Use Topics  . Alcohol use: No    Alcohol/week: 0.0 oz  . Drug use: No     Colonoscopy:  PAP: March 2016  Bone density:  Lipid panel:  Allergies  Allergen Reactions  . Amitriptyline Palpitations and Other (See Comments)    And racing heart  . Sulfa Antibiotics Hives  . Tetracycline Hives    Patient thinks this is the name of the medication  . Tetracyclines & Related Hives  . Codeine Nausea And Vomiting    Current Outpatient Medications  Medication Sig Dispense Refill  . aspirin 81 MG tablet Take 81 mg by mouth daily.    Marland Kitchen b complex vitamins tablet Take 1 tablet by mouth daily.    . Calcium Carbonate-Vitamin D (CALCIUM + D PO) Take 1 tablet by mouth daily.      . Coenzyme Q10 (CO Q 10 PO) Take by mouth as directed.    . fexofenadine (ALLEGRA) 180 MG tablet Take 180 mg by mouth daily.    . fluticasone (FLONASE) 50 MCG/ACT nasal spray Place 2 sprays into both nostrils daily as needed for allergies.     . Multiple Vitamin (MULTIVITAMIN) capsule Take 1 capsule by mouth daily.    . propranolol (INDERAL) 20 MG tablet Take 10-20 mg by mouth 2 (two) times daily as needed (Tachycardia).    Marland Kitchen UNABLE TO FIND as directed. Cres Off    . Vitamin D, Cholecalciferol, 1000 UNITS CAPS Take 1 capsule by mouth daily.      No current facility-administered medications for this visit.     OBJECTIVE: Middle-aged white woman who appears well Vitals:   12/05/17 1438  BP: 120/75  Wells: (!) 105  Resp: 18  Temp: 98.4 F (36.9 C)  SpO2: 98%     Body mass index is 22.3 kg/m.    ECOG FS: 0  Filed Weights   12/05/17 1438  Weight: 129 lb 14.4 oz (58.9 kg)   Sclerae unicteric, pupils round and equal Oropharynx clear and moist No cervical or supraclavicular adenopathy Lungs no rales or rhonchi Heart regular rate and rhythm Abd soft, nontender, positive bowel sounds MSK no focal spinal tenderness, no upper extremity lymphedema Neuro: nonfocal, well oriented, appropriate affect Breasts: Status post bilateral mastectomies with bilateral implant reconstruction.  There is slight asymmetry.  There is no mass erythema or tenderness to palpation in the left axilla or the lateral aspect of the left reconstructed breast.  The left implant does appear to have slipped slightly laterally.  Both axillae are benign  LAB RESULTS: Lab Results  Component Value Date   WBC 7.3 08/01/2014   NEUTROABS 5.5 08/01/2014  HGB 13.2 08/01/2014   HCT 39.2 08/01/2014   MCV 92.5 08/01/2014   PLT 226 08/01/2014      Chemistry      Component Value Date/Time   NA 141 08/01/2014 1407   NA 140 10/29/2012 1424   K 4.2 08/01/2014 1407   K 4.1 10/29/2012 1424   CL 101 08/01/2014 1407   CL  106 10/29/2012 1424   CO2 28 08/01/2014 1407   CO2 28 10/29/2012 1424   BUN 20 08/01/2014 1407   BUN 19.0 10/29/2012 1424   CREATININE 0.74 08/01/2014 1407   CREATININE 0.8 10/29/2012 1424      Component Value Date/Time   CALCIUM 9.8 08/01/2014 1407   CALCIUM 9.6 10/29/2012 1424   ALKPHOS 68 08/27/2013 1633   ALKPHOS 96 10/29/2012 1424   AST 26 08/27/2013 1633   AST 24 10/29/2012 1424   ALT 22 08/27/2013 1633   ALT 20 10/29/2012 1424   BILITOT 0.7 08/27/2013 1633   BILITOT 0.32 10/29/2012 1424       Lab Results  Component Value Date   LABCA2 17 10/29/2012    STUDIES: Since her last visit, she completed a lumbar spine x-ray on 03/28/2018 showing: Pars defects at L5 bilaterally with 12 mm of anterolisthesis of L5 on S1, not appreciably changed from prior study. There is facet osteoarthritic change at L4-5 and L5-S1 bilaterally, more severe at L5-S1. No new spondylolisthesis. No fracture. No blastic or lytic bone lesions are evident. There is mild scoliosis. There is mild aortic atherosclerosis.   ASSESSMENT: 67 y.o. BRCA negative but PALB2 positive [place] woman   (1) status post left mastectomy and sentinel lymph node dissection in April 2010 for a T1c N0, stage IA invasive ductal carcinoma, grade 3, ER "positive" at 5%, PR and HER2/neu negative, with an MIB-1 of 49%; treated adjuvantly with 4 cycles of dose dense doxorubicin/ cyclophosphamide completed June 2010.  On tamoxifen very briefly, discontinued due to intolerance. Decided to forego aromatase inhibitors due to her history of osteopenia.   (2) status post right simple mastectomy in July 2013 for a high-grade ductal carcinoma in situ, strongly estrogen and progesterone receptor positive, both at 100%, grade 3, with clear margins. The single sentinel lymph node was clear.  (3)  completed implant reconstruction bilaterally under the care of Dr. Harlow Wells 08/21/2012  (4) attempted tamoxifen x2, both times developing  significant vaginal discharge leading to discontinuation; decided against adjuvant antiestrogen therapy  (5) found to carry a PALB2 mutation   (a) s/p bilateral mastectomies  (b) concern regarding possible increased risk of ovarian cancer (one third-degree relative positive)  (c) screening of family members and prenatal counseling re Fanconi's anemia discussed  (d) in absence of any family history of pancreatic cancer, screening not recommended  PLAN: Candace Wells is now 5-1/2 years from her most recent surgery which was for noninvasive breast cancer, almost certainly cured with her mastectomy.  There is no evidence of disease activity.  The discomfort she experiences in the lateral aspect of the left breast that in the left axilla has pretty much resolved with her not wearing a bra.  Nevertheless this is not satisfactory to her and she is willing to undergo further surgery if necessary to correct it.  I offered her some Neurontin but she tells me she cannot tolerate that medication or Lyrica.  In fact it is difficult for her to take any medication at all.  My best guess is that the left implant has slipped slightly laterally.  We could obtain an MRI here, but she needs to follow-up with her plastic surgeon and he may not need an MRI or if he does he will have better access to it if he orders it locally.  Accordingly the plan is to send her back to Dr. Harle Wells, who did her most recent reconstruction.    Incidentally 2 of Candace Wells his daughters have tested positive for the PALB2 mutation.  They are aware of their options in terms of risk reduction according to [facility].  I am not following Candace Wells here actively but I will be glad to see her at any point in the future if and when the need arises  Mistee Soliman, Virgie Dad, MD  12/05/17 2:50 PM Medical Oncology and [facility]ology [facility] [place], [place] [address] Tel. [phone]    Fax. [phone]  This document serves as a  record of services personally performed by Lurline Del, MD. It was created on his behalf by Sheron Nightingale, a trained medical scribe. The creation of this record is based on the scribe's personal observations and the provider's statements to them.   I have reviewed the above documentation for accuracy and completeness, and I agree with the above.

## 2017-12-05 NOTE — Telephone Encounter (Signed)
msg left with pt's spouse with appt information with Dr Harle Stanford. appt scheduled for 3/1.

## 2017-12-05 NOTE — Telephone Encounter (Signed)
LVM at Dr Baruch Goldmann office requesting that he see pt.  Nurse direct number given for return call to facilitate referral.

## 2017-12-06 ENCOUNTER — Telehealth: Payer: Self-pay | Admitting: Oncology

## 2017-12-06 NOTE — Telephone Encounter (Signed)
Per 2/19 no los °

## 2018-01-04 ENCOUNTER — Encounter: Payer: Self-pay | Admitting: Internal Medicine

## 2018-01-10 ENCOUNTER — Encounter: Payer: Self-pay | Admitting: *Deleted

## 2018-01-11 ENCOUNTER — Telehealth: Payer: Self-pay

## 2018-01-11 NOTE — Telephone Encounter (Signed)
Called pt to inform her Dr. Jana Hakim has suggested she refer to Dr. Wynne Dust at [facility]. Phone number provided for patient. She will call if she needs anything further regarding this.  Cyndia Bent RN

## 2018-01-15 ENCOUNTER — Ambulatory Visit: Payer: Medicare HMO | Admitting: Internal Medicine

## 2018-01-15 ENCOUNTER — Encounter (INDEPENDENT_AMBULATORY_CARE_PROVIDER_SITE_OTHER): Payer: Self-pay

## 2018-01-15 ENCOUNTER — Encounter: Payer: Self-pay | Admitting: Internal Medicine

## 2018-01-15 VITALS — BP 124/60 | HR 91 | Ht 64.0 in | Wt 130.0 lb

## 2018-01-15 DIAGNOSIS — R000 Tachycardia, unspecified: Secondary | ICD-10-CM

## 2018-01-15 DIAGNOSIS — R002 Palpitations: Secondary | ICD-10-CM | POA: Diagnosis not present

## 2018-01-15 DIAGNOSIS — I480 Paroxysmal atrial fibrillation: Secondary | ICD-10-CM

## 2018-01-15 NOTE — Progress Notes (Signed)
PCP: Lawerance Cruel, MD Primary Cardiologist: Dr Meda Coffee Primary EP: Dr Rayann Heman  Candace Wells is a 67 y.o. female who presents today for routine electrophysiology followup.  Since last being seen in our clinic, the patient reports doing very well.  She continues to have occasional palpitations and a sense of sinus tachycardia.  Overall, she is doing quite well. She is considering reconstructive breast surgery this year.  Today, she denies symptoms of chest pain, shortness of breath,  lower extremity edema, dizziness, presyncope, or syncope.  The patient is otherwise without complaint today.   Past Medical History:  Diagnosis Date  . atrial fib   . Breast cancer ([place]) 2010   left  . Osteoporosis   . Palpitations    Past Surgical History:  Procedure Laterality Date  . BREAST BIOPSY  02/28/2012   Procedure: BREAST BIOPSY WITH NEEDLE LOCALIZATION;  Surgeon: Rolm Bookbinder, MD;  Location: [place];  Service: General;  Laterality: Right;  Right breast wire guided biopsy  . BREAST RECONSTRUCTION  04/23/2012   Procedure: BREAST RECONSTRUCTION;  Surgeon: Crissie Reese, MD;  Location: [place];  Service: Plastics;  Laterality: Bilateral;  bilateral breast reconstruction with Tissue Expander placement  . BREAST SURGERY  2010   left mastectomy and snbx  . DILATION AND CURETTAGE OF UTERUS    . TONSILLECTOMY      ROS- all systems are reviewed and negatives except as per HPI above  Current Outpatient Medications  Medication Sig Dispense Refill  . aspirin 81 MG tablet Take 81 mg by mouth daily.    Marland Kitchen b complex vitamins tablet Take 1 tablet by mouth daily.    . Calcium Carbonate-Vitamin D (CALCIUM + D PO) Take 1 tablet by mouth daily.     . Coenzyme Q10 (CO Q 10 PO) Take by mouth as directed.    . fexofenadine (ALLEGRA) 180 MG tablet Take 180 mg by mouth daily.    . fluticasone (FLONASE) 50 MCG/ACT nasal spray Place 2 sprays into both nostrils daily as  needed for allergies.     . Multiple Vitamin (MULTIVITAMIN) capsule Take 1 capsule by mouth daily.    . propranolol (INDERAL) 20 MG tablet Take 10-20 mg by mouth 2 (two) times daily as needed (Tachycardia).    Marland Kitchen UNABLE TO FIND as directed. Cres Off    . Vitamin D, Cholecalciferol, 1000 UNITS CAPS Take 1 capsule by mouth daily.      No current facility-administered medications for this visit.     Physical Exam: Vitals:   01/15/18 1405  BP: 124/60  Pulse: 91  Weight: 59 kg (130 lb)  Height: 5\' 4"  (1.626 m)    GEN- The patient is well appearing, alert and oriented x 3 today.   Head- normocephalic, atraumatic Eyes-  Sclera clear, conjunctiva pink Ears- hearing intact Oropharynx- clear Lungs- Clear to ausculation bilaterally, normal work of breathing Heart- Regular rate and rhythm, no murmurs, rubs or gallops, PMI not laterally displaced GI- soft, NT, ND, + BS Extremities- no clubbing, cyanosis, or edema  EKG tracing ordered today is personally reviewed and shows sinus rhythm 91 bpm, PR 140 msec, otherwise normal ekg  Assessment and Plan:  1. Tachycardia previously only nonsustained afib and sinus tach Would not advise anticoagulation at this time  Given prior chemotherapy, I would advise Echo (last echo 2015)  Regular exercise encouraged  Return to see EP PA in a year  Thompson Grayer MD, [facility] 01/15/2018 2:20 PM

## 2018-01-15 NOTE — Patient Instructions (Addendum)
Medication Instructions:  Your physician recommends that you continue on your current medications as directed. Please refer to the Current Medication list given to you today.  Labwork: None ordered.  Testing/Procedures: Your physician has requested that you have an echocardiogram. Echocardiography is a painless test that uses sound waves to create images of your heart. It provides your doctor with information about the size and shape of your heart and how well your heart's chambers and valves are working. This procedure takes approximately one hour. There are no restrictions for this procedure.  Please schedule for an ECHO.  Follow-Up: Your physician wants you to follow-up in:  12 months with Tommye Standard, PA.   You will receive a reminder letter in the mail two months in advance. If you don't receive a letter, please call our office to schedule the follow-up appointment.  Any Other Special Instructions Will Be Listed Below (If Applicable).  If you need a refill on your cardiac medications before your next appointment, please call your pharmacy.

## 2018-01-17 NOTE — Addendum Note (Signed)
Addended by: Marlis Edelson C on: 01/17/2018 04:26 PM   Modules accepted: Orders

## 2018-01-22 ENCOUNTER — Ambulatory Visit (HOSPITAL_COMMUNITY): Payer: Medicare HMO | Attending: Cardiology

## 2018-01-22 ENCOUNTER — Other Ambulatory Visit: Payer: Self-pay

## 2018-01-22 DIAGNOSIS — I480 Paroxysmal atrial fibrillation: Secondary | ICD-10-CM | POA: Diagnosis not present

## 2018-01-22 DIAGNOSIS — R000 Tachycardia, unspecified: Secondary | ICD-10-CM | POA: Diagnosis not present

## 2018-01-22 DIAGNOSIS — I5189 Other ill-defined heart diseases: Secondary | ICD-10-CM | POA: Insufficient documentation

## 2018-01-22 DIAGNOSIS — R002 Palpitations: Secondary | ICD-10-CM | POA: Insufficient documentation

## 2018-01-22 DIAGNOSIS — I4891 Unspecified atrial fibrillation: Secondary | ICD-10-CM | POA: Insufficient documentation

## 2018-01-22 DIAGNOSIS — Z853 Personal history of malignant neoplasm of breast: Secondary | ICD-10-CM | POA: Diagnosis not present

## 2018-02-20 DIAGNOSIS — R109 Unspecified abdominal pain: Secondary | ICD-10-CM | POA: Diagnosis not present

## 2018-02-20 DIAGNOSIS — Z0000 Encounter for general adult medical examination without abnormal findings: Secondary | ICD-10-CM | POA: Diagnosis not present

## 2018-02-20 DIAGNOSIS — E7800 Pure hypercholesterolemia, unspecified: Secondary | ICD-10-CM | POA: Diagnosis not present

## 2018-02-20 DIAGNOSIS — Z1389 Encounter for screening for other disorder: Secondary | ICD-10-CM | POA: Diagnosis not present

## 2018-02-20 DIAGNOSIS — R000 Tachycardia, unspecified: Secondary | ICD-10-CM | POA: Diagnosis not present

## 2018-02-20 DIAGNOSIS — Z131 Encounter for screening for diabetes mellitus: Secondary | ICD-10-CM | POA: Diagnosis not present

## 2018-02-20 DIAGNOSIS — R69 Illness, unspecified: Secondary | ICD-10-CM | POA: Diagnosis not present

## 2018-02-20 DIAGNOSIS — I700 Atherosclerosis of aorta: Secondary | ICD-10-CM | POA: Diagnosis not present

## 2018-02-20 DIAGNOSIS — E559 Vitamin D deficiency, unspecified: Secondary | ICD-10-CM | POA: Diagnosis not present

## 2018-02-27 DIAGNOSIS — R350 Frequency of micturition: Secondary | ICD-10-CM | POA: Diagnosis not present

## 2018-02-27 DIAGNOSIS — Z6822 Body mass index (BMI) 22.0-22.9, adult: Secondary | ICD-10-CM | POA: Diagnosis not present

## 2018-02-27 DIAGNOSIS — Z01419 Encounter for gynecological examination (general) (routine) without abnormal findings: Secondary | ICD-10-CM | POA: Diagnosis not present

## 2018-03-05 DIAGNOSIS — R932 Abnormal findings on diagnostic imaging of liver and biliary tract: Secondary | ICD-10-CM | POA: Diagnosis not present

## 2018-03-05 DIAGNOSIS — M8589 Other specified disorders of bone density and structure, multiple sites: Secondary | ICD-10-CM | POA: Diagnosis not present

## 2018-03-05 DIAGNOSIS — R109 Unspecified abdominal pain: Secondary | ICD-10-CM | POA: Diagnosis not present

## 2018-03-05 DIAGNOSIS — M810 Age-related osteoporosis without current pathological fracture: Secondary | ICD-10-CM | POA: Diagnosis not present

## 2018-03-13 DIAGNOSIS — K5730 Diverticulosis of large intestine without perforation or abscess without bleeding: Secondary | ICD-10-CM | POA: Diagnosis not present

## 2018-03-13 DIAGNOSIS — R194 Change in bowel habit: Secondary | ICD-10-CM | POA: Diagnosis not present

## 2018-03-13 DIAGNOSIS — K824 Cholesterolosis of gallbladder: Secondary | ICD-10-CM | POA: Diagnosis not present

## 2018-03-13 DIAGNOSIS — Z86010 Personal history of colonic polyps: Secondary | ICD-10-CM | POA: Diagnosis not present

## 2018-03-13 DIAGNOSIS — R933 Abnormal findings on diagnostic imaging of other parts of digestive tract: Secondary | ICD-10-CM | POA: Diagnosis not present

## 2018-03-19 DIAGNOSIS — E559 Vitamin D deficiency, unspecified: Secondary | ICD-10-CM | POA: Diagnosis not present

## 2018-03-19 DIAGNOSIS — M810 Age-related osteoporosis without current pathological fracture: Secondary | ICD-10-CM | POA: Diagnosis not present

## 2018-04-09 ENCOUNTER — Telehealth: Payer: Self-pay | Admitting: Adult Health

## 2018-04-09 DIAGNOSIS — Z9889 Other specified postprocedural states: Secondary | ICD-10-CM | POA: Diagnosis not present

## 2018-04-09 DIAGNOSIS — C50112 Malignant neoplasm of central portion of left female breast: Secondary | ICD-10-CM | POA: Diagnosis not present

## 2018-04-09 DIAGNOSIS — Z853 Personal history of malignant neoplasm of breast: Secondary | ICD-10-CM | POA: Diagnosis not present

## 2018-04-09 DIAGNOSIS — Z170 Estrogen receptor positive status [ER+]: Secondary | ICD-10-CM | POA: Diagnosis not present

## 2018-04-09 NOTE — Telephone Encounter (Signed)
Patient came in to reschedule  °

## 2018-04-10 ENCOUNTER — Telehealth: Payer: Self-pay | Admitting: Adult Health

## 2018-04-10 NOTE — Telephone Encounter (Signed)
Patient called to cancel °

## 2018-04-11 ENCOUNTER — Inpatient Hospital Stay: Payer: Medicare HMO | Admitting: Adult Health

## 2018-05-08 DIAGNOSIS — C50112 Malignant neoplasm of central portion of left female breast: Secondary | ICD-10-CM | POA: Diagnosis not present

## 2018-05-08 DIAGNOSIS — Z853 Personal history of malignant neoplasm of breast: Secondary | ICD-10-CM | POA: Diagnosis not present

## 2018-05-08 DIAGNOSIS — Z170 Estrogen receptor positive status [ER+]: Secondary | ICD-10-CM | POA: Diagnosis not present

## 2018-05-08 DIAGNOSIS — Z9889 Other specified postprocedural states: Secondary | ICD-10-CM | POA: Diagnosis not present

## 2018-05-21 ENCOUNTER — Ambulatory Visit: Payer: Medicare HMO | Admitting: Adult Health

## 2018-05-28 DIAGNOSIS — R69 Illness, unspecified: Secondary | ICD-10-CM | POA: Diagnosis not present

## 2018-06-04 DIAGNOSIS — G459 Transient cerebral ischemic attack, unspecified: Secondary | ICD-10-CM | POA: Diagnosis not present

## 2018-06-04 DIAGNOSIS — C50912 Malignant neoplasm of unspecified site of left female breast: Secondary | ICD-10-CM | POA: Diagnosis not present

## 2018-06-04 DIAGNOSIS — I480 Paroxysmal atrial fibrillation: Secondary | ICD-10-CM | POA: Diagnosis not present

## 2018-06-04 DIAGNOSIS — C50112 Malignant neoplasm of central portion of left female breast: Secondary | ICD-10-CM | POA: Diagnosis not present

## 2018-06-04 DIAGNOSIS — Z9889 Other specified postprocedural states: Secondary | ICD-10-CM | POA: Diagnosis not present

## 2018-06-04 DIAGNOSIS — R69 Illness, unspecified: Secondary | ICD-10-CM | POA: Diagnosis not present

## 2018-06-04 DIAGNOSIS — Z170 Estrogen receptor positive status [ER+]: Secondary | ICD-10-CM | POA: Diagnosis not present

## 2018-06-04 DIAGNOSIS — Z853 Personal history of malignant neoplasm of breast: Secondary | ICD-10-CM | POA: Diagnosis not present

## 2018-06-04 DIAGNOSIS — E785 Hyperlipidemia, unspecified: Secondary | ICD-10-CM | POA: Diagnosis not present

## 2018-06-04 DIAGNOSIS — C50911 Malignant neoplasm of unspecified site of right female breast: Secondary | ICD-10-CM | POA: Diagnosis not present

## 2018-06-05 DIAGNOSIS — C50911 Malignant neoplasm of unspecified site of right female breast: Secondary | ICD-10-CM | POA: Diagnosis not present

## 2018-06-05 DIAGNOSIS — C50912 Malignant neoplasm of unspecified site of left female breast: Secondary | ICD-10-CM | POA: Diagnosis not present

## 2018-06-21 DIAGNOSIS — Z853 Personal history of malignant neoplasm of breast: Secondary | ICD-10-CM | POA: Diagnosis not present

## 2018-06-21 DIAGNOSIS — E785 Hyperlipidemia, unspecified: Secondary | ICD-10-CM | POA: Diagnosis not present

## 2018-06-21 DIAGNOSIS — I959 Hypotension, unspecified: Secondary | ICD-10-CM | POA: Diagnosis not present

## 2018-06-21 DIAGNOSIS — M19042 Primary osteoarthritis, left hand: Secondary | ICD-10-CM | POA: Diagnosis not present

## 2018-06-21 DIAGNOSIS — I480 Paroxysmal atrial fibrillation: Secondary | ICD-10-CM | POA: Diagnosis not present

## 2018-06-21 DIAGNOSIS — M19041 Primary osteoarthritis, right hand: Secondary | ICD-10-CM | POA: Diagnosis not present

## 2018-06-21 DIAGNOSIS — Z421 Encounter for breast reconstruction following mastectomy: Secondary | ICD-10-CM | POA: Diagnosis not present

## 2018-06-21 DIAGNOSIS — R55 Syncope and collapse: Secondary | ICD-10-CM | POA: Diagnosis not present

## 2018-06-21 DIAGNOSIS — R69 Illness, unspecified: Secondary | ICD-10-CM | POA: Diagnosis not present

## 2018-06-21 DIAGNOSIS — R001 Bradycardia, unspecified: Secondary | ICD-10-CM | POA: Diagnosis not present

## 2018-06-21 DIAGNOSIS — R002 Palpitations: Secondary | ICD-10-CM | POA: Diagnosis not present

## 2018-06-21 DIAGNOSIS — Z9013 Acquired absence of bilateral breasts and nipples: Secondary | ICD-10-CM | POA: Diagnosis not present

## 2018-06-22 DIAGNOSIS — R002 Palpitations: Secondary | ICD-10-CM | POA: Diagnosis not present

## 2018-07-02 DIAGNOSIS — C50112 Malignant neoplasm of central portion of left female breast: Secondary | ICD-10-CM | POA: Diagnosis not present

## 2018-07-02 DIAGNOSIS — Z1509 Genetic susceptibility to other malignant neoplasm: Secondary | ICD-10-CM | POA: Diagnosis not present

## 2018-07-02 DIAGNOSIS — Z1589 Genetic susceptibility to other disease: Secondary | ICD-10-CM | POA: Diagnosis not present

## 2018-07-02 DIAGNOSIS — C50912 Malignant neoplasm of unspecified site of left female breast: Secondary | ICD-10-CM | POA: Diagnosis not present

## 2018-07-02 DIAGNOSIS — C50911 Malignant neoplasm of unspecified site of right female breast: Secondary | ICD-10-CM | POA: Diagnosis not present

## 2018-07-02 DIAGNOSIS — Z170 Estrogen receptor positive status [ER+]: Secondary | ICD-10-CM | POA: Diagnosis not present

## 2018-07-02 DIAGNOSIS — Z9889 Other specified postprocedural states: Secondary | ICD-10-CM | POA: Diagnosis not present

## 2018-07-02 DIAGNOSIS — Z1501 Genetic susceptibility to malignant neoplasm of breast: Secondary | ICD-10-CM | POA: Diagnosis not present

## 2018-07-10 DIAGNOSIS — Z1509 Genetic susceptibility to other malignant neoplasm: Secondary | ICD-10-CM | POA: Diagnosis not present

## 2018-07-10 DIAGNOSIS — L03311 Cellulitis of abdominal wall: Secondary | ICD-10-CM | POA: Diagnosis not present

## 2018-07-10 DIAGNOSIS — C50912 Malignant neoplasm of unspecified site of left female breast: Secondary | ICD-10-CM | POA: Diagnosis not present

## 2018-07-10 DIAGNOSIS — Z170 Estrogen receptor positive status [ER+]: Secondary | ICD-10-CM | POA: Diagnosis not present

## 2018-07-10 DIAGNOSIS — Z1501 Genetic susceptibility to malignant neoplasm of breast: Secondary | ICD-10-CM | POA: Diagnosis not present

## 2018-07-10 DIAGNOSIS — C50911 Malignant neoplasm of unspecified site of right female breast: Secondary | ICD-10-CM | POA: Diagnosis not present

## 2018-07-10 DIAGNOSIS — Z9889 Other specified postprocedural states: Secondary | ICD-10-CM | POA: Diagnosis not present

## 2018-07-10 DIAGNOSIS — C50112 Malignant neoplasm of central portion of left female breast: Secondary | ICD-10-CM | POA: Diagnosis not present

## 2018-07-10 DIAGNOSIS — Z1589 Genetic susceptibility to other disease: Secondary | ICD-10-CM | POA: Diagnosis not present

## 2018-07-16 DIAGNOSIS — Z853 Personal history of malignant neoplasm of breast: Secondary | ICD-10-CM | POA: Diagnosis not present

## 2018-07-16 DIAGNOSIS — C50911 Malignant neoplasm of unspecified site of right female breast: Secondary | ICD-10-CM | POA: Diagnosis not present

## 2018-07-16 DIAGNOSIS — Z1509 Genetic susceptibility to other malignant neoplasm: Secondary | ICD-10-CM | POA: Diagnosis not present

## 2018-07-16 DIAGNOSIS — Z9889 Other specified postprocedural states: Secondary | ICD-10-CM | POA: Diagnosis not present

## 2018-07-16 DIAGNOSIS — Z1589 Genetic susceptibility to other disease: Secondary | ICD-10-CM | POA: Diagnosis not present

## 2018-07-16 DIAGNOSIS — Z1501 Genetic susceptibility to malignant neoplasm of breast: Secondary | ICD-10-CM | POA: Diagnosis not present

## 2018-07-16 DIAGNOSIS — Z170 Estrogen receptor positive status [ER+]: Secondary | ICD-10-CM | POA: Diagnosis not present

## 2018-07-16 DIAGNOSIS — C50912 Malignant neoplasm of unspecified site of left female breast: Secondary | ICD-10-CM | POA: Diagnosis not present

## 2018-07-16 DIAGNOSIS — C50112 Malignant neoplasm of central portion of left female breast: Secondary | ICD-10-CM | POA: Diagnosis not present

## 2018-08-20 DIAGNOSIS — R69 Illness, unspecified: Secondary | ICD-10-CM | POA: Diagnosis not present

## 2018-09-10 DIAGNOSIS — Z1501 Genetic susceptibility to malignant neoplasm of breast: Secondary | ICD-10-CM | POA: Diagnosis not present

## 2018-09-10 DIAGNOSIS — Z170 Estrogen receptor positive status [ER+]: Secondary | ICD-10-CM | POA: Diagnosis not present

## 2018-09-10 DIAGNOSIS — Z1589 Genetic susceptibility to other disease: Secondary | ICD-10-CM | POA: Diagnosis not present

## 2018-09-10 DIAGNOSIS — C50911 Malignant neoplasm of unspecified site of right female breast: Secondary | ICD-10-CM | POA: Diagnosis not present

## 2018-09-10 DIAGNOSIS — C50912 Malignant neoplasm of unspecified site of left female breast: Secondary | ICD-10-CM | POA: Diagnosis not present

## 2018-09-10 DIAGNOSIS — C50112 Malignant neoplasm of central portion of left female breast: Secondary | ICD-10-CM | POA: Diagnosis not present

## 2018-09-10 DIAGNOSIS — Z1509 Genetic susceptibility to other malignant neoplasm: Secondary | ICD-10-CM | POA: Diagnosis not present

## 2018-10-04 DIAGNOSIS — Z8673 Personal history of transient ischemic attack (TIA), and cerebral infarction without residual deficits: Secondary | ICD-10-CM | POA: Diagnosis not present

## 2018-10-04 DIAGNOSIS — E785 Hyperlipidemia, unspecified: Secondary | ICD-10-CM | POA: Diagnosis not present

## 2018-10-04 DIAGNOSIS — C50112 Malignant neoplasm of central portion of left female breast: Secondary | ICD-10-CM | POA: Diagnosis not present

## 2018-10-04 DIAGNOSIS — I480 Paroxysmal atrial fibrillation: Secondary | ICD-10-CM | POA: Diagnosis not present

## 2018-10-04 DIAGNOSIS — Z170 Estrogen receptor positive status [ER+]: Secondary | ICD-10-CM | POA: Diagnosis not present

## 2018-10-04 DIAGNOSIS — N651 Disproportion of reconstructed breast: Secondary | ICD-10-CM | POA: Diagnosis not present

## 2018-10-04 DIAGNOSIS — Z421 Encounter for breast reconstruction following mastectomy: Secondary | ICD-10-CM | POA: Diagnosis not present

## 2018-10-04 DIAGNOSIS — Z9013 Acquired absence of bilateral breasts and nipples: Secondary | ICD-10-CM | POA: Diagnosis not present

## 2018-10-04 DIAGNOSIS — Z853 Personal history of malignant neoplasm of breast: Secondary | ICD-10-CM | POA: Diagnosis not present

## 2018-10-04 DIAGNOSIS — L905 Scar conditions and fibrosis of skin: Secondary | ICD-10-CM | POA: Diagnosis not present

## 2018-10-04 DIAGNOSIS — N650 Deformity of reconstructed breast: Secondary | ICD-10-CM | POA: Diagnosis not present

## 2018-10-04 DIAGNOSIS — Z79899 Other long term (current) drug therapy: Secondary | ICD-10-CM | POA: Diagnosis not present

## 2018-10-08 DIAGNOSIS — Z1501 Genetic susceptibility to malignant neoplasm of breast: Secondary | ICD-10-CM | POA: Diagnosis not present

## 2018-10-08 DIAGNOSIS — Z1509 Genetic susceptibility to other malignant neoplasm: Secondary | ICD-10-CM | POA: Diagnosis not present

## 2018-10-08 DIAGNOSIS — Z1589 Genetic susceptibility to other disease: Secondary | ICD-10-CM | POA: Diagnosis not present

## 2018-10-08 DIAGNOSIS — Z170 Estrogen receptor positive status [ER+]: Secondary | ICD-10-CM | POA: Diagnosis not present

## 2018-10-08 DIAGNOSIS — C50911 Malignant neoplasm of unspecified site of right female breast: Secondary | ICD-10-CM | POA: Diagnosis not present

## 2018-10-08 DIAGNOSIS — C50112 Malignant neoplasm of central portion of left female breast: Secondary | ICD-10-CM | POA: Diagnosis not present

## 2018-10-08 DIAGNOSIS — C50912 Malignant neoplasm of unspecified site of left female breast: Secondary | ICD-10-CM | POA: Diagnosis not present

## 2018-10-15 DIAGNOSIS — Z1509 Genetic susceptibility to other malignant neoplasm: Secondary | ICD-10-CM | POA: Diagnosis not present

## 2018-10-15 DIAGNOSIS — Z1589 Genetic susceptibility to other disease: Secondary | ICD-10-CM | POA: Diagnosis not present

## 2018-10-15 DIAGNOSIS — Z170 Estrogen receptor positive status [ER+]: Secondary | ICD-10-CM | POA: Diagnosis not present

## 2018-10-15 DIAGNOSIS — Z1501 Genetic susceptibility to malignant neoplasm of breast: Secondary | ICD-10-CM | POA: Diagnosis not present

## 2018-10-15 DIAGNOSIS — C50112 Malignant neoplasm of central portion of left female breast: Secondary | ICD-10-CM | POA: Diagnosis not present

## 2018-10-15 DIAGNOSIS — Z9889 Other specified postprocedural states: Secondary | ICD-10-CM | POA: Diagnosis not present

## 2018-10-15 DIAGNOSIS — C50912 Malignant neoplasm of unspecified site of left female breast: Secondary | ICD-10-CM | POA: Diagnosis not present

## 2018-10-15 DIAGNOSIS — C50911 Malignant neoplasm of unspecified site of right female breast: Secondary | ICD-10-CM | POA: Diagnosis not present

## 2018-10-15 DIAGNOSIS — Z853 Personal history of malignant neoplasm of breast: Secondary | ICD-10-CM | POA: Diagnosis not present

## 2018-11-19 DIAGNOSIS — Z1509 Genetic susceptibility to other malignant neoplasm: Secondary | ICD-10-CM | POA: Diagnosis not present

## 2018-11-19 DIAGNOSIS — C50112 Malignant neoplasm of central portion of left female breast: Secondary | ICD-10-CM | POA: Diagnosis not present

## 2018-11-19 DIAGNOSIS — C50911 Malignant neoplasm of unspecified site of right female breast: Secondary | ICD-10-CM | POA: Diagnosis not present

## 2018-11-19 DIAGNOSIS — Z170 Estrogen receptor positive status [ER+]: Secondary | ICD-10-CM | POA: Diagnosis not present

## 2018-11-19 DIAGNOSIS — Z1501 Genetic susceptibility to malignant neoplasm of breast: Secondary | ICD-10-CM | POA: Diagnosis not present

## 2018-11-19 DIAGNOSIS — Z1589 Genetic susceptibility to other disease: Secondary | ICD-10-CM | POA: Diagnosis not present

## 2018-11-19 DIAGNOSIS — C50912 Malignant neoplasm of unspecified site of left female breast: Secondary | ICD-10-CM | POA: Diagnosis not present

## 2018-12-18 DIAGNOSIS — R69 Illness, unspecified: Secondary | ICD-10-CM | POA: Diagnosis not present

## 2019-02-01 ENCOUNTER — Telehealth: Payer: Self-pay

## 2019-02-01 ENCOUNTER — Telehealth: Payer: Self-pay | Admitting: *Deleted

## 2019-02-01 ENCOUNTER — Other Ambulatory Visit: Payer: Self-pay | Admitting: Oncology

## 2019-02-01 DIAGNOSIS — N949 Unspecified condition associated with female genital organs and menstrual cycle: Secondary | ICD-10-CM

## 2019-02-01 NOTE — Progress Notes (Signed)
Given Melisssa's persistent symptoms and her risk of ovarian cancer I think we need to proceed and obtain a CT of the abdomen and pelvis for her.  I have placed that order.  Hopefully we can get it done early next week.  She will call us if she has not heard from radiology by 02/05/2019

## 2019-02-01 NOTE — Telephone Encounter (Signed)
Nurse returned call to patient regarding changes in bowels.  Patient reports that she had a surgery in 06/2018 where they removed fat from abdominal area for her breast.    Patient reports that she is having severe bloating, abdominal discomfort, and her bowel movements have been "different."  Pt reports no constipation, but does state they are "thinner" and has increasing lower back pain.  Reports symptoms have been present for 2 months, however increasingly getting worse.  Pt spoke with surgeons office, per patient they did not feel this was surgery related.    Will review with MD for further recommendations.

## 2019-02-01 NOTE — Telephone Encounter (Signed)
Patient was able to speak with Dr Jana Hakim.  Message to get CT authorized

## 2019-02-04 ENCOUNTER — Other Ambulatory Visit: Payer: Self-pay | Admitting: *Deleted

## 2019-02-04 DIAGNOSIS — C50911 Malignant neoplasm of unspecified site of right female breast: Secondary | ICD-10-CM

## 2019-02-04 DIAGNOSIS — C50912 Malignant neoplasm of unspecified site of left female breast: Principal | ICD-10-CM

## 2019-02-04 DIAGNOSIS — C50112 Malignant neoplasm of central portion of left female breast: Secondary | ICD-10-CM

## 2019-02-04 DIAGNOSIS — Z170 Estrogen receptor positive status [ER+]: Principal | ICD-10-CM

## 2019-02-05 ENCOUNTER — Other Ambulatory Visit: Payer: Self-pay

## 2019-02-05 ENCOUNTER — Encounter (HOSPITAL_COMMUNITY): Payer: Self-pay

## 2019-02-05 ENCOUNTER — Other Ambulatory Visit: Payer: Self-pay | Admitting: Oncology

## 2019-02-05 ENCOUNTER — Ambulatory Visit (HOSPITAL_COMMUNITY)
Admission: RE | Admit: 2019-02-05 | Discharge: 2019-02-05 | Disposition: A | Discharge status: Home / Self Care | Payer: Medicare HMO | Source: Ambulatory Visit | Attending: Oncology | Admitting: Oncology

## 2019-02-05 ENCOUNTER — Ambulatory Visit (HOSPITAL_COMMUNITY): Payer: Medicare HMO

## 2019-02-05 ENCOUNTER — Other Ambulatory Visit (HOSPITAL_COMMUNITY): Payer: Medicare HMO

## 2019-02-05 DIAGNOSIS — N949 Unspecified condition associated with female genital organs and menstrual cycle: Secondary | ICD-10-CM | POA: Diagnosis present

## 2019-02-05 DIAGNOSIS — R1030 Lower abdominal pain, unspecified: Secondary | ICD-10-CM | POA: Diagnosis not present

## 2019-02-05 LAB — POCT I-STAT CREATININE: Creatinine, Ser: 0.5 mg/dL (ref 0.44–1.00)

## 2019-02-05 MED ORDER — IOHEXOL 300 MG/ML  SOLN
100.0000 mL | Freq: Once | INTRAMUSCULAR | Status: AC | PRN
  Administered 2019-02-05: 100 mL via INTRAVENOUS

## 2019-02-05 MED ORDER — SODIUM CHLORIDE (PF) 0.9 % IJ SOLN
INTRAMUSCULAR | Status: AC
  Filled 2019-02-05: qty 50

## 2019-02-05 NOTE — Progress Notes (Unsigned)
I called Candace Wells with the results of her CT of the abdomen and pelvis which are very favorable.  There is nothing suggestive of cancer  She does have obvious constipation.  She tells me she is having regular bowel movements but does not feel she can get it all out and remains bloated even after movement.  I suggested she try a one-time bottle of magnesium citrate and see if that changes things.  I do not recommend of course that she do that more than 1 time

## 2019-03-04 DIAGNOSIS — Z01419 Encounter for gynecological examination (general) (routine) without abnormal findings: Secondary | ICD-10-CM | POA: Diagnosis not present

## 2019-03-04 DIAGNOSIS — Z6821 Body mass index (BMI) 21.0-21.9, adult: Secondary | ICD-10-CM | POA: Diagnosis not present

## 2019-03-04 DIAGNOSIS — Z124 Encounter for screening for malignant neoplasm of cervix: Secondary | ICD-10-CM | POA: Diagnosis not present

## 2019-03-22 DIAGNOSIS — Z20828 Contact with and (suspected) exposure to other viral communicable diseases: Secondary | ICD-10-CM | POA: Diagnosis not present

## 2019-04-02 DIAGNOSIS — Z0000 Encounter for general adult medical examination without abnormal findings: Secondary | ICD-10-CM | POA: Diagnosis not present

## 2019-04-05 ENCOUNTER — Telehealth: Payer: Self-pay

## 2019-04-05 NOTE — Telephone Encounter (Signed)
Spoke with pt regarding appt on 04/08/19. Pt stated she will set up her MyChart and call if she has any issues. Pt questions were address.

## 2019-04-08 ENCOUNTER — Telehealth (INDEPENDENT_AMBULATORY_CARE_PROVIDER_SITE_OTHER): Payer: Medicare HMO | Admitting: Internal Medicine

## 2019-04-08 ENCOUNTER — Encounter: Payer: Self-pay | Admitting: Internal Medicine

## 2019-04-08 VITALS — HR 113 | Ht 64.0 in | Wt 125.0 lb

## 2019-04-08 DIAGNOSIS — R002 Palpitations: Secondary | ICD-10-CM | POA: Diagnosis not present

## 2019-04-08 NOTE — Progress Notes (Signed)
Electrophysiology TeleHealth Note   Due to national recommendations of social distancing due to COVID 19, an audio/video telehealth visit is felt to be most appropriate for this patient at this time.  See MyChart message from today for the patient's consent to telehealth for [facility].   Date:  04/08/2019   ID:  Candace Wells, DOB [birth year], MRN [id number]  Location: patient's home  Provider location:  [facility]  Evaluation Performed: Follow-up visit  PCP:  Lawerance Cruel, MD   Electrophysiologist:  Dr Rayann Heman  Chief Complaint:  palpitations  History of Present Illness:    Candace Wells is a 68 y.o. female who presents via telehealth conferencing today.  Since last being seen in our clinic, the patient reports doing very well.  She has rare  typically when "worried or anxious".  Rare palpitations. Today, she denies symptoms of palpitations, chest pain, shortness of breath,  lower extremity edema, dizziness, presyncope, or syncope.  The patient is otherwise without complaint today.  The patient denies symptoms of fevers, chills, cough, or new SOB worrisome for COVID 19.  Past Medical History:  Diagnosis Date  . atrial fib   . Breast cancer ([place]) 2010   left  . Osteoporosis   . Palpitations     Past Surgical History:  Procedure Laterality Date  . BREAST BIOPSY  02/28/2012   Procedure: BREAST BIOPSY WITH NEEDLE LOCALIZATION;  Surgeon: Rolm Bookbinder, MD;  Location: [place];  Service: General;  Laterality: Right;  Right breast wire guided biopsy  . BREAST RECONSTRUCTION  04/23/2012   Procedure: BREAST RECONSTRUCTION;  Surgeon: Crissie Reese, MD;  Location: [place];  Service: Plastics;  Laterality: Bilateral;  bilateral breast reconstruction with Tissue Expander placement  . BREAST SURGERY  2010   left mastectomy and snbx  . DILATION AND CURETTAGE OF UTERUS    . TONSILLECTOMY      Current Outpatient Medications   Medication Sig Dispense Refill  . ALPRAZolam (XANAX) 0.5 MG tablet Take 0.5 tablets by mouth as needed for sleep.    Marland Kitchen aspirin 81 MG tablet Take 81 mg by mouth daily.    Marland Kitchen b complex vitamins tablet Take 1 tablet by mouth daily.    . Calcium Carbonate-Vitamin D (CALCIUM + D PO) Take 1 tablet by mouth daily.     . fexofenadine (ALLEGRA) 180 MG tablet Take 180 mg by mouth daily.    . fluticasone (FLONASE) 50 MCG/ACT nasal spray Place 2 sprays into both nostrils daily as needed for allergies.     . Multiple Vitamin (MULTIVITAMIN) capsule Take 1 capsule by mouth daily.    . propranolol (INDERAL) 20 MG tablet Take 10-20 mg by mouth 2 (two) times daily as needed (Tachycardia).    Marland Kitchen UNABLE TO FIND as directed. Cres Off    . Vitamin D, Cholecalciferol, 1000 UNITS CAPS Take 1 capsule by mouth daily.      No current facility-administered medications for this visit.     Allergies:   Amitriptyline, Sulfa antibiotics, Tetracycline, Tetracyclines & related, and Codeine   Social History:  The patient  reports that she has never smoked. She has never used smokeless tobacco. She reports that she does not drink alcohol or use drugs.   Family History:  The patient's family history includes Breast cancer in her cousin and other family members; Breast cancer (age of onset: 17) in her mother; Breast cancer (age of onset: 85) in her maternal aunt; Cancer  in her paternal uncle; Colon polyps (age of onset: 25) in her father; Heart Problems in her maternal grandfather and paternal grandmother; Heart disease in her maternal grandfather; Hypertension in her father and maternal grandmother; Leukemia in her cousin; Leukemia (age of onset: 60) in an other family member; Other in her daughter; Ovarian cancer in an other family member; Ulcers in her paternal uncle.   ROS:  Please see the history of present illness.   All other systems are personally reviewed and negative.    Exam:    Vital Signs:  Pulse (!) 113   Ht 5\' 4"   (1.626 m)   Wt 125 lb (56.7 kg)   BMI 21.46 kg/m   Well appearing, OP clear, NAD, normal WOB   Labs/Other Tests and Data Reviewed:    Recent Labs: 02/05/2019: Creatinine, Ser 0.50   Wt Readings from Last 3 Encounters:  04/08/19 125 lb (56.7 kg)  01/15/18 130 lb (59 kg)  12/05/17 129 lb 14.4 oz (58.9 kg)    Echo 01/22/18 reviewed  ASSESSMENT & PLAN:    1.  Tachycardia/ palpitations Previously documented to be sinus tach and nonsustained afib Avoid anticoagulation unless arrhythmias increase She has an AppleWatch however it does not have ekg technology   Follow-up:  12 months with EP PA   Patient Risk:  after full review of this patients clinical status, I feel that they are at moderate risk at this time.  Today, I have spent 15 minutes with the patient with telehealth technology discussing arrhythmia management .    Army Fossa, MD  04/08/2019 9:40 AM     [place] [address] [phone] (office) [phone] (fax)

## 2019-04-17 DIAGNOSIS — E559 Vitamin D deficiency, unspecified: Secondary | ICD-10-CM | POA: Diagnosis not present

## 2019-04-17 DIAGNOSIS — I700 Atherosclerosis of aorta: Secondary | ICD-10-CM | POA: Diagnosis not present

## 2019-04-17 DIAGNOSIS — R69 Illness, unspecified: Secondary | ICD-10-CM | POA: Diagnosis not present

## 2019-04-17 DIAGNOSIS — M810 Age-related osteoporosis without current pathological fracture: Secondary | ICD-10-CM | POA: Diagnosis not present

## 2019-04-17 DIAGNOSIS — Z131 Encounter for screening for diabetes mellitus: Secondary | ICD-10-CM | POA: Diagnosis not present

## 2019-04-17 DIAGNOSIS — R000 Tachycardia, unspecified: Secondary | ICD-10-CM | POA: Diagnosis not present

## 2019-04-17 DIAGNOSIS — E7800 Pure hypercholesterolemia, unspecified: Secondary | ICD-10-CM | POA: Diagnosis not present

## 2019-04-29 DIAGNOSIS — Z86010 Personal history of colonic polyps: Secondary | ICD-10-CM | POA: Diagnosis not present

## 2019-05-06 DIAGNOSIS — E559 Vitamin D deficiency, unspecified: Secondary | ICD-10-CM | POA: Diagnosis not present

## 2019-05-06 DIAGNOSIS — R69 Illness, unspecified: Secondary | ICD-10-CM | POA: Diagnosis not present

## 2019-05-06 DIAGNOSIS — M810 Age-related osteoporosis without current pathological fracture: Secondary | ICD-10-CM | POA: Diagnosis not present

## 2019-05-06 DIAGNOSIS — I700 Atherosclerosis of aorta: Secondary | ICD-10-CM | POA: Diagnosis not present

## 2019-05-06 DIAGNOSIS — Z131 Encounter for screening for diabetes mellitus: Secondary | ICD-10-CM | POA: Diagnosis not present

## 2019-05-06 DIAGNOSIS — E7800 Pure hypercholesterolemia, unspecified: Secondary | ICD-10-CM | POA: Diagnosis not present

## 2019-05-06 DIAGNOSIS — R000 Tachycardia, unspecified: Secondary | ICD-10-CM | POA: Diagnosis not present

## 2019-06-10 DIAGNOSIS — K648 Other hemorrhoids: Secondary | ICD-10-CM | POA: Diagnosis not present

## 2019-06-10 DIAGNOSIS — K635 Polyp of colon: Secondary | ICD-10-CM | POA: Diagnosis not present

## 2019-06-10 DIAGNOSIS — D122 Benign neoplasm of ascending colon: Secondary | ICD-10-CM | POA: Diagnosis not present

## 2019-06-10 DIAGNOSIS — K5730 Diverticulosis of large intestine without perforation or abscess without bleeding: Secondary | ICD-10-CM | POA: Diagnosis not present

## 2019-06-10 DIAGNOSIS — Z86010 Personal history of colonic polyps: Secondary | ICD-10-CM | POA: Diagnosis not present

## 2019-06-12 DIAGNOSIS — K635 Polyp of colon: Secondary | ICD-10-CM | POA: Diagnosis not present

## 2019-06-12 DIAGNOSIS — D122 Benign neoplasm of ascending colon: Secondary | ICD-10-CM | POA: Diagnosis not present

## 2019-07-01 DIAGNOSIS — R69 Illness, unspecified: Secondary | ICD-10-CM | POA: Diagnosis not present

## 2019-07-13 DIAGNOSIS — R69 Illness, unspecified: Secondary | ICD-10-CM | POA: Diagnosis not present

## 2019-07-16 DIAGNOSIS — R1032 Left lower quadrant pain: Secondary | ICD-10-CM | POA: Diagnosis not present

## 2019-10-07 ENCOUNTER — Other Ambulatory Visit: Payer: Self-pay | Admitting: Dermatology

## 2019-10-07 DIAGNOSIS — D229 Melanocytic nevi, unspecified: Secondary | ICD-10-CM | POA: Diagnosis not present

## 2019-10-07 DIAGNOSIS — D485 Neoplasm of uncertain behavior of skin: Secondary | ICD-10-CM | POA: Diagnosis not present

## 2019-10-07 DIAGNOSIS — L820 Inflamed seborrheic keratosis: Secondary | ICD-10-CM | POA: Diagnosis not present

## 2019-10-07 DIAGNOSIS — B079 Viral wart, unspecified: Secondary | ICD-10-CM | POA: Diagnosis not present

## 2019-12-30 DIAGNOSIS — R69 Illness, unspecified: Secondary | ICD-10-CM | POA: Diagnosis not present

## 2020-04-06 DIAGNOSIS — H2513 Age-related nuclear cataract, bilateral: Secondary | ICD-10-CM | POA: Diagnosis not present

## 2020-04-06 DIAGNOSIS — H524 Presbyopia: Secondary | ICD-10-CM | POA: Diagnosis not present

## 2020-04-14 DIAGNOSIS — Z6823 Body mass index (BMI) 23.0-23.9, adult: Secondary | ICD-10-CM | POA: Diagnosis not present

## 2020-04-14 DIAGNOSIS — Z01419 Encounter for gynecological examination (general) (routine) without abnormal findings: Secondary | ICD-10-CM | POA: Diagnosis not present

## 2020-04-15 DIAGNOSIS — B351 Tinea unguium: Secondary | ICD-10-CM | POA: Diagnosis not present

## 2020-04-15 DIAGNOSIS — Z131 Encounter for screening for diabetes mellitus: Secondary | ICD-10-CM | POA: Diagnosis not present

## 2020-04-15 DIAGNOSIS — E559 Vitamin D deficiency, unspecified: Secondary | ICD-10-CM | POA: Diagnosis not present

## 2020-04-15 DIAGNOSIS — M19049 Primary osteoarthritis, unspecified hand: Secondary | ICD-10-CM | POA: Diagnosis not present

## 2020-04-15 DIAGNOSIS — Z0000 Encounter for general adult medical examination without abnormal findings: Secondary | ICD-10-CM | POA: Diagnosis not present

## 2020-04-15 DIAGNOSIS — M810 Age-related osteoporosis without current pathological fracture: Secondary | ICD-10-CM | POA: Diagnosis not present

## 2020-04-15 DIAGNOSIS — R1013 Epigastric pain: Secondary | ICD-10-CM | POA: Diagnosis not present

## 2020-04-15 DIAGNOSIS — R69 Illness, unspecified: Secondary | ICD-10-CM | POA: Diagnosis not present

## 2020-04-15 DIAGNOSIS — R000 Tachycardia, unspecified: Secondary | ICD-10-CM | POA: Diagnosis not present

## 2020-04-15 DIAGNOSIS — E7800 Pure hypercholesterolemia, unspecified: Secondary | ICD-10-CM | POA: Diagnosis not present

## 2020-04-20 NOTE — Progress Notes (Signed)
Cardiology Office Note Date:  04/21/2020  Patient ID:  Candace Wells 14-Nov-1950, MRN [id number] PCP:  Lawerance Cruel, MD  Electrophysiologist  Dr. Rayann Heman    Chief Complaint:  annual visit  History of Present Illness: Candace Wells is a 69 y.o. female with history of breast cancer (L mastectomy), Afib.  She comes in today to be seen for Dr. Rayann Heman, last seen by him via tele health Jun 2020.  At that time reports rare palpitations when worried or anxious.   He mentions h/o palpitations previously documented to be sinus tach and nonsustained AFib.  Not on a/c unless arrhythmia burden increased.  She continues to do well.  She is still working as a Probation officer and is very active in general.  She denies any CP or exertional intolerances.  No dizzy spells, near syncope or syncope. She has bfreif palpitations maybe once a month, typically with just take a few deep breaths and they resolve.  Rarely uses the Inderal.  Her PMD monitors her cholesterol, she reports done recently with the total number being OK, but the ratio not in her favor with low HDL.  She was recommended OTC option, mentioning she would never use a statin regardless.  Reviewed her medical hx, no changes since her last visit, CHA2DS2Vasc remains one (2 with female gender)   Past Medical History:  Diagnosis Date   atrial fib    Breast cancer ([place]) 2010   left   Osteoporosis    Palpitations     Past Surgical History:  Procedure Laterality Date   BREAST BIOPSY  02/28/2012   Procedure: BREAST BIOPSY WITH NEEDLE LOCALIZATION;  Surgeon: Rolm Bookbinder, MD;  Location: [place];  Service: General;  Laterality: Right;  Right breast wire guided biopsy   BREAST RECONSTRUCTION  04/23/2012   Procedure: BREAST RECONSTRUCTION;  Surgeon: Crissie Reese, MD;  Location: [place];  Service: Plastics;  Laterality: Bilateral;  bilateral breast reconstruction with Tissue Expander  placement   BREAST SURGERY  2010   left mastectomy and snbx   DILATION AND CURETTAGE OF UTERUS     TONSILLECTOMY      Current Outpatient Medications  Medication Sig Dispense Refill   ALPRAZolam (XANAX) 0.5 MG tablet Take 0.5 tablets by mouth as needed for sleep.     aspirin 81 MG tablet Take 81 mg by mouth daily.     b complex vitamins tablet Take 1 tablet by mouth daily.     Calcium Carbonate-Vitamin D (CALCIUM + D PO) Take 1 tablet by mouth daily.      fexofenadine (ALLEGRA) 180 MG tablet Take 180 mg by mouth daily.     fluticasone (FLONASE) 50 MCG/ACT nasal spray Place 2 sprays into both nostrils daily as needed for allergies.      Multiple Vitamin (MULTIVITAMIN) capsule Take 1 capsule by mouth daily.     propranolol (INDERAL) 20 MG tablet Take 10-20 mg by mouth 2 (two) times daily as needed (Tachycardia).     UNABLE TO FIND as directed. Cres Off     Vitamin D, Cholecalciferol, 1000 UNITS CAPS Take 1 capsule by mouth daily.      No current facility-administered medications for this visit.    Allergies:   Amitriptyline, Sulfa antibiotics, Tetracycline, Tetracyclines & related, and Codeine   Social History:  The patient  reports that she has never smoked. She has never used smokeless tobacco. She reports that she does not drink alcohol and does  not use drugs.   Family History:  The patient's family history includes Breast cancer in her cousin and other family members; Breast cancer (age of onset: 70) in her mother; Breast cancer (age of onset: 75) in her maternal aunt; Cancer in her paternal uncle; Colon polyps (age of onset: 81) in her father; Heart Problems in her maternal grandfather and paternal grandmother; Heart disease in her maternal grandfather; Hypertension in her father and maternal grandmother; Leukemia in her cousin; Leukemia (age of onset: 61) in an other family member; Other in her daughter; Ovarian cancer in an other family member; Ulcers in her paternal  uncle.  ROS:  Please see the history of present illness.  All other systems are reviewed and otherwise negative.   PHYSICAL EXAM:  VS:  BP 110/64    Pulse 82    Ht 5\' 4"  (1.626 m)    Wt 136 lb (61.7 kg)    BMI 23.34 kg/m  BMI: Body mass index is 23.34 kg/m. Well nourished, well developed, in no acute distress  HEENT: normocephalic, atraumatic  Neck: no JVD, carotid bruits or masses Cardiac:  RRR; no significant murmurs, no rubs, or gallops Lungs:  CTA b/l, no wheezing, rhonchi or rales  Abd: soft, nontender MS: no deformity or atrophy Ext: no edema  Skin: warm and dry, no rash Neuro:  No gross deficits appreciated Psych: euthymic mood, full affect    EKG:  Done today and reviewed by myself shows  SR 82bpm, normal   01/22/2018: TTE Study Conclusions  - Left ventricle: The cavity size was normal. Systolic function was  normal. The estimated ejection fraction was in the range of 55%  to 60%. Wall motion was normal; there were no regional wall  motion abnormalities. Doppler parameters are consistent with  abnormal left ventricular relaxation (grade 1 diastolic  dysfunction).  - Strain analysis was not performed. Lateral mitral annulus  systolic velocity is normal at 8 cm/s.   Impressions:  - Compared to 2703, systolic function as measured by EF and lateral  mitral annulus systolic velocity remain normal. There is,  however, evidence of new diastolic dysfunction as measured by  diastolic annulus velocities and mitral inflow parameters.  Suggest repeat imaging with strain rate if there is suspicion for  chemotherapy related cardiotoxicity.    Recent Labs: No results found for requested labs within last 8760 hours.  No results found for requested labs within last 8760 hours.   CrCl cannot be calculated (Patient's most recent lab result is older than the maximum 21 days allowed.).   Wt Readings from Last 3 Encounters:  04/21/20 136 lb (61.7 kg)   04/08/19 125 lb (56.7 kg)  01/15/18 130 lb (59 kg)     Other studies reviewed: Additional studies/records reviewed today include: summarized above  ASSESSMENT AND PLAN:  1. Palpitations     Historically sinus tachycardi and nonsustained Afib noted     CHA2DS2Vasc is 2 (with gender), not on a/c     Infrequent and fleeting palpitations only   No changes     Disposition: F/u with annually as she has been, sooner if needed  Current medicines are reviewed at length with the patient today.  The patient did not have any concerns regarding medicines.  Venetia Night, PA-C 04/21/2020 2:50 PM     [place] [address] [phone] (office)  [phone] (fax)

## 2020-04-21 ENCOUNTER — Other Ambulatory Visit: Payer: Self-pay

## 2020-04-21 ENCOUNTER — Ambulatory Visit: Payer: Medicare HMO | Admitting: Physician Assistant

## 2020-04-21 VITALS — BP 110/64 | HR 82 | Ht 64.0 in | Wt 136.0 lb

## 2020-04-21 DIAGNOSIS — I480 Paroxysmal atrial fibrillation: Secondary | ICD-10-CM | POA: Diagnosis not present

## 2020-04-21 NOTE — Patient Instructions (Signed)

## 2020-05-05 DIAGNOSIS — R140 Abdominal distension (gaseous): Secondary | ICD-10-CM | POA: Diagnosis not present

## 2020-05-05 DIAGNOSIS — R1030 Lower abdominal pain, unspecified: Secondary | ICD-10-CM | POA: Diagnosis not present

## 2020-05-05 DIAGNOSIS — K5900 Constipation, unspecified: Secondary | ICD-10-CM | POA: Diagnosis not present

## 2020-06-17 DIAGNOSIS — R140 Abdominal distension (gaseous): Secondary | ICD-10-CM | POA: Diagnosis not present

## 2020-07-06 DIAGNOSIS — R69 Illness, unspecified: Secondary | ICD-10-CM | POA: Diagnosis not present

## 2020-10-15 DIAGNOSIS — Z20828 Contact with and (suspected) exposure to other viral communicable diseases: Secondary | ICD-10-CM | POA: Diagnosis not present

## 2020-11-09 DIAGNOSIS — H02821 Cysts of right upper eyelid: Secondary | ICD-10-CM | POA: Diagnosis not present

## 2021-03-16 DIAGNOSIS — R197 Diarrhea, unspecified: Secondary | ICD-10-CM | POA: Diagnosis not present

## 2021-03-16 DIAGNOSIS — R140 Abdominal distension (gaseous): Secondary | ICD-10-CM | POA: Diagnosis not present

## 2021-03-16 DIAGNOSIS — K5900 Constipation, unspecified: Secondary | ICD-10-CM | POA: Diagnosis not present

## 2021-03-16 DIAGNOSIS — R195 Other fecal abnormalities: Secondary | ICD-10-CM | POA: Diagnosis not present

## 2021-04-12 DIAGNOSIS — H5203 Hypermetropia, bilateral: Secondary | ICD-10-CM | POA: Diagnosis not present

## 2021-04-12 DIAGNOSIS — H2513 Age-related nuclear cataract, bilateral: Secondary | ICD-10-CM | POA: Diagnosis not present

## 2021-05-04 DIAGNOSIS — Z1389 Encounter for screening for other disorder: Secondary | ICD-10-CM | POA: Diagnosis not present

## 2021-05-04 DIAGNOSIS — Z0000 Encounter for general adult medical examination without abnormal findings: Secondary | ICD-10-CM | POA: Diagnosis not present

## 2021-05-09 NOTE — Progress Notes (Signed)
PCP:  Lawerance Cruel, MD Primary Cardiologist: None Electrophysiologist: Thompson Grayer, MD   Candace Wells is a 70 y.o. female seen today for Thompson Grayer, MD for routine electrophysiology followup.  Since last being seen in our clinic the patient reports doing overall well. She has intermittent palpitations and uses propranolol 1-3 times a month. She has considered using it daily as more of a prophylactic.  She woke up this am with her laundry room flooded, but thankfully there is a stepdown from the rest of the house. Her husband has also recently been diagnosed with a terminal leukemia.  she denies chest pain, dyspnea, PND, orthopnea, nausea, vomiting, dizziness, syncope, edema, weight gain, or early satiety. She is not sure whether she truly carries a diagnosis of a stroke.   Past Medical History:  Diagnosis Date   atrial fib    Breast cancer ([place]) 2010   left   Osteoporosis    Palpitations    Past Surgical History:  Procedure Laterality Date   BREAST BIOPSY  02/28/2012   Procedure: BREAST BIOPSY WITH NEEDLE LOCALIZATION;  Surgeon: Rolm Bookbinder, MD;  Location: [place];  Service: General;  Laterality: Right;  Right breast wire guided biopsy   BREAST RECONSTRUCTION  04/23/2012   Procedure: BREAST RECONSTRUCTION;  Surgeon: Crissie Reese, MD;  Location: [place];  Service: Plastics;  Laterality: Bilateral;  bilateral breast reconstruction with Tissue Expander placement   BREAST SURGERY  2010   left mastectomy and snbx   DILATION AND CURETTAGE OF UTERUS     TONSILLECTOMY      Current Outpatient Medications  Medication Sig Dispense Refill   ALPRAZolam (XANAX) 0.5 MG tablet Take 0.5 tablets by mouth as needed for sleep.     aspirin 81 MG tablet Take 81 mg by mouth daily.     b complex vitamins tablet Take 1 tablet by mouth daily.     Calcium Carbonate-Vitamin D (CALCIUM + D PO) Take 1 tablet by mouth daily.      fexofenadine (ALLEGRA) 180  MG tablet Take 180 mg by mouth daily.     fluticasone (FLONASE) 50 MCG/ACT nasal spray Place 2 sprays into both nostrils daily as needed for allergies.      Multiple Vitamin (MULTIVITAMIN) capsule Take 1 capsule by mouth daily.     UNABLE TO FIND as directed. Cres Off     Vitamin D, Cholecalciferol, 1000 UNITS CAPS Take 1 capsule by mouth daily.      propranolol (INDERAL) 20 MG tablet Take 0.5-1 tablets (10-20 mg total) by mouth 2 (two) times daily as needed (Tachycardia). 90 tablet 3   No current facility-administered medications for this visit.    Allergies  Allergen Reactions   Amitriptyline Palpitations and Other (See Comments)    And racing heart And racing heart   Sulfa Antibiotics Hives   Tetracycline Hives    Patient thinks this is the name of the medication   Tetracyclines & Related Hives   Codeine Nausea And Vomiting and Nausea Only    Social History   Socioeconomic History   Marital status: Married    Spouse name: Not on file   Number of children: 3   Years of education: 12   Highest education level: Not on file  Occupational History   Not on file  Tobacco Use   Smoking status: Never   Smokeless tobacco: Never  Vaping Use   Vaping Use: Never used  Substance and Sexual Activity  Alcohol use: No    Alcohol/week: 0.0 standard drinks   Drug use: No   Sexual activity: Not on file  Other Topics Concern   Not on file  Social History Narrative   Patient is married with 3 children.   Patient is right handed.   Patient has hs education.   Patient drinks 2 times a week.   Social Determinants of Health   Financial Resource Strain: Not on file  Food Insecurity: Not on file  Transportation Needs: Not on file  Physical Activity: Not on file  Stress: Not on file  Social Connections: Not on file  Intimate Partner Violence: Not on file     Review of Systems: General: No chills, fever, night sweats or weight changes  Cardiovascular:  No chest pain, dyspnea on  exertion, edema, orthopnea, palpitations, paroxysmal nocturnal dyspnea Dermatological: No rash, lesions or masses Respiratory: No cough, dyspnea Urologic: No hematuria, dysuria Abdominal: No nausea, vomiting, diarrhea, bright red blood per rectum, melena, or hematemesis Neurologic: No visual changes, weakness, changes in mental status All other systems reviewed and are otherwise negative except as noted above.  Physical Exam: Vitals:   05/10/21 1024  BP: 110/68  Pulse: 79  SpO2: 93%  Weight: 123 lb (55.8 kg)  Height: '5\' 4"'$  (1.626 m)    GEN- The patient is well appearing, alert and oriented x 3 today.   HEENT: normocephalic, atraumatic; sclera clear, conjunctiva pink; hearing intact; oropharynx clear; neck supple, no JVP Lymph- no cervical lymphadenopathy Lungs- Clear to ausculation bilaterally, normal work of breathing.  No wheezes, rales, rhonchi Heart- Regular rate and rhythm, no murmurs, rubs or gallops, PMI not laterally displaced GI- soft, non-tender, non-distended, bowel sounds present, no hepatosplenomegaly Extremities- no clubbing, cyanosis, or edema; DP/PT/radial pulses 2+ bilaterally MS- no significant deformity or atrophy Skin- warm and dry, no rash or lesion Psych- euthymic mood, full affect Neuro- strength and sensation are intact  EKG is ordered. Personal review of EKG from today shows NSR at 82 bpm, PR interval 132 ms, QRS 78 ms  Additional studies reviewed include: Previous EP office notes  Assessment and Plan:  1. Palpitations H/o sinus tachycardia and felt to have sub-clinical atrial fibrillation CHA2DS2/VASc is either 2 or 4.  She had TIA like symptoms in 2015 without MRI changes. It is unclear if this is felt to be a true stroke or not based on previous notes.   Discussed risks and benefits and will continue to monitor clinically for increased burden of AF, otherwise will remain off [place].     Shirley Friar, PA-C  05/10/21 11:24 AM

## 2021-05-10 ENCOUNTER — Ambulatory Visit: Payer: Medicare HMO | Admitting: Physician Assistant

## 2021-05-10 ENCOUNTER — Ambulatory Visit: Payer: Medicare HMO | Admitting: Student

## 2021-05-10 ENCOUNTER — Encounter: Payer: Self-pay | Admitting: Student

## 2021-05-10 ENCOUNTER — Other Ambulatory Visit: Payer: Self-pay

## 2021-05-10 VITALS — BP 110/68 | HR 79 | Ht 64.0 in | Wt 123.0 lb

## 2021-05-10 DIAGNOSIS — G459 Transient cerebral ischemic attack, unspecified: Secondary | ICD-10-CM

## 2021-05-10 DIAGNOSIS — I480 Paroxysmal atrial fibrillation: Secondary | ICD-10-CM

## 2021-05-10 DIAGNOSIS — R002 Palpitations: Secondary | ICD-10-CM

## 2021-05-10 MED ORDER — PROPRANOLOL HCL 20 MG PO TABS: 10.0000 mg | ORAL_TABLET | Freq: Two times a day (BID) | ORAL | 3 refills | Status: DC | PRN

## 2021-05-10 NOTE — Patient Instructions (Signed)
Medication Instructions:  Your physician recommends that you continue on your current medications as directed. Please refer to the Current Medication list given to you today.  *If you need a refill on your cardiac medications before your next appointment, please call your pharmacy*   Lab Work: None If you have labs (blood work) drawn today and your tests are completely normal, you will receive your results only by: [place] (if you have MyChart) OR A paper copy in the mail If you have any lab test that is abnormal or we need to change your treatment, we will call you to review the results.   Follow-Up: At [facility], you and your health needs are our priority.  As part of our continuing mission to provide you with exceptional heart care, we have created designated Provider Care Teams.  These Care Teams include your primary Cardiologist (physician) and Advanced Practice Providers (APPs -  Physician Assistants and Nurse Practitioners) who all work together to provide you with the care you need, when you need it.  Your next appointment:   1 year(s)  The format for your next appointment:   In Person  Provider:   You may see Thompson Grayer, MD or one of the following Advanced Practice Providers on your designated Care Team:   Tommye Standard, [place] Legrand Como [facility] [place], [place]

## 2021-05-11 DIAGNOSIS — Z124 Encounter for screening for malignant neoplasm of cervix: Secondary | ICD-10-CM | POA: Diagnosis not present

## 2021-05-11 DIAGNOSIS — Z6820 Body mass index (BMI) 20.0-20.9, adult: Secondary | ICD-10-CM | POA: Diagnosis not present

## 2021-05-13 DIAGNOSIS — R69 Illness, unspecified: Secondary | ICD-10-CM | POA: Diagnosis not present

## 2021-05-13 DIAGNOSIS — E875 Hyperkalemia: Secondary | ICD-10-CM | POA: Diagnosis not present

## 2021-05-13 DIAGNOSIS — E559 Vitamin D deficiency, unspecified: Secondary | ICD-10-CM | POA: Diagnosis not present

## 2021-05-13 DIAGNOSIS — E7800 Pure hypercholesterolemia, unspecified: Secondary | ICD-10-CM | POA: Diagnosis not present

## 2021-05-13 DIAGNOSIS — I700 Atherosclerosis of aorta: Secondary | ICD-10-CM | POA: Diagnosis not present

## 2021-05-13 DIAGNOSIS — Z131 Encounter for screening for diabetes mellitus: Secondary | ICD-10-CM | POA: Diagnosis not present

## 2021-05-13 DIAGNOSIS — Z79899 Other long term (current) drug therapy: Secondary | ICD-10-CM | POA: Diagnosis not present

## 2021-06-08 DIAGNOSIS — E7800 Pure hypercholesterolemia, unspecified: Secondary | ICD-10-CM | POA: Diagnosis not present

## 2021-06-08 DIAGNOSIS — Z79899 Other long term (current) drug therapy: Secondary | ICD-10-CM | POA: Diagnosis not present

## 2021-06-08 DIAGNOSIS — Z131 Encounter for screening for diabetes mellitus: Secondary | ICD-10-CM | POA: Diagnosis not present

## 2021-06-08 DIAGNOSIS — E875 Hyperkalemia: Secondary | ICD-10-CM | POA: Diagnosis not present

## 2021-06-08 DIAGNOSIS — I700 Atherosclerosis of aorta: Secondary | ICD-10-CM | POA: Diagnosis not present

## 2021-06-08 DIAGNOSIS — E559 Vitamin D deficiency, unspecified: Secondary | ICD-10-CM | POA: Diagnosis not present

## 2021-07-28 ENCOUNTER — Encounter: Payer: Self-pay | Admitting: Dermatology

## 2021-07-28 ENCOUNTER — Ambulatory Visit: Payer: Medicare HMO | Admitting: Dermatology

## 2021-07-28 ENCOUNTER — Other Ambulatory Visit: Payer: Self-pay

## 2021-07-28 DIAGNOSIS — D1801 Hemangioma of skin and subcutaneous tissue: Secondary | ICD-10-CM

## 2021-07-28 DIAGNOSIS — Z1283 Encounter for screening for malignant neoplasm of skin: Secondary | ICD-10-CM

## 2021-07-28 DIAGNOSIS — L821 Other seborrheic keratosis: Secondary | ICD-10-CM

## 2021-08-14 ENCOUNTER — Encounter: Payer: Self-pay | Admitting: Dermatology

## 2021-08-14 NOTE — Progress Notes (Signed)
   Follow-Up Visit   Subjective  Candace Wells is a 70 y.o. female who presents for the following: Annual Exam (Yearly check, possible new lesion on the back x months. No history of atypical moles or skin cancer).  General skin examination, several spots to check Location:  Duration:  Quality:  Associated Signs/Symptoms: Modifying Factors:  Severity:  Timing: Context:   Objective  Well appearing patient in no apparent distress; mood and affect are within normal limits. Torso - Posterior (Back) No atypical nevi or signs of NMSC noted at the time of the visit.   Mid Back Flattopped brown 4 to 6 mm textured papules  Abdomen (Lower Torso, Anterior), Torso - Posterior (Back) Multiple 1 to 2 mm smooth red dermal papules    A full examination was performed including scalp, head, eyes, ears, nose, lips, neck, chest, axillae, abdomen, back, buttocks, bilateral upper extremities, bilateral lower extremities, hands, feet, fingers, toes, fingernails, and toenails. All findings within normal limits unless otherwise noted below.  Areas beneath undergarments not fully examined.   Assessment & Plan    Skin exam for malignant neoplasm Torso - Posterior (Back)  Yearly skin check, encouraged to self examine twice annually.  Continue ultraviolet protection.  Seborrheic keratosis Mid Back  Intervention not necessary  Hemangioma of skin (2) Abdomen (Lower Torso, Anterior); Torso - Posterior (Back)  Benign okay to leave.       I, Lavonna Monarch, MD, have reviewed all documentation for this visit.  The documentation on 08/14/21 for the exam, diagnosis, procedures, and orders are all accurate and complete.

## 2021-11-03 ENCOUNTER — Ambulatory Visit: Payer: Medicare HMO | Admitting: Dermatology

## 2022-02-21 DIAGNOSIS — R140 Abdominal distension (gaseous): Secondary | ICD-10-CM | POA: Diagnosis not present

## 2022-02-21 DIAGNOSIS — K5900 Constipation, unspecified: Secondary | ICD-10-CM | POA: Diagnosis not present

## 2022-02-21 DIAGNOSIS — L609 Nail disorder, unspecified: Secondary | ICD-10-CM | POA: Diagnosis not present

## 2022-02-21 DIAGNOSIS — R21 Rash and other nonspecific skin eruption: Secondary | ICD-10-CM | POA: Diagnosis not present

## 2022-03-07 ENCOUNTER — Ambulatory Visit: Payer: Medicare HMO | Admitting: Podiatry

## 2022-03-07 DIAGNOSIS — B351 Tinea unguium: Secondary | ICD-10-CM

## 2022-03-07 MED ORDER — EFINACONAZOLE 10 % EX SOLN: 1.0000 [drp] | Freq: Every day | CUTANEOUS | 11 refills | Status: DC

## 2022-03-07 NOTE — Patient Instructions (Signed)
Efinaconazole Topical Solution What is this medication? EFINACONAZOLE (e FEE na KON a zole) is an antifungal medicine. It is used to treat certain kinds of fungal infections of the toenail. This medicine may be used for other purposes; ask your health care provider or pharmacist if you have questions. COMMON BRAND NAME(S): JUBLIA What should I tell my care team before I take this medication? They need to know if you have any of these conditions: an unusual or allergic reaction to efinaconazole, other medicines, foods, dyes or preservatives pregnant or trying to get pregnant breast-feeding How should I use this medication? This medicine is for external use only. Do not take by mouth. Follow the directions on the label. Wash hands before and after use. Apply this medicine using the provided brush to cover the entire toenail. Do not use your medicine more often than directed. Finish the full course prescribed by your doctor or health care professional even if you think your condition is better. Do not stop using except on the advice of your doctor or health care professional. Talk to your pediatrician regarding the use of this medicine in children. While this drug may be prescribed for children as young as 6 years for selected conditions, precautions do apply. Overdosage: If you think you have taken too much of this medicine contact a poison control center or emergency room at once. NOTE: This medicine is only for you. Do not share this medicine with others. What if I miss a dose? If you miss a dose, use it as soon as you can. If it is almost time for your next dose, use only that dose. Do not use double or extra doses. What may interact with this medication? Interactions have not been studied. Do not use any other nail products (i.e., nail polish, pedicures) during treatment with this medicine. This list may not describe all possible interactions. Give your health care provider a list of all the  medicines, herbs, non-prescription drugs, or dietary supplements you use. Also tell them if you smoke, drink alcohol, or use illegal drugs. Some items may interact with your medicine. What should I watch for while using this medication? Do not get this medicine in your eyes. If you do, rinse out with plenty of cool tap water. Tell your doctor or health care professional if your symptoms do not start to get better or if they get worse. Wait for at least 10 minutes after bathing before applying this medication. After bathing, make sure that your feet are very dry. Fungal infections like moist conditions. Do not walk around barefoot. To help prevent reinfection, wear freshly washed cotton, not synthetic clothing. Tell your doctor or health care professional if you develop sores or blisters that do not heal properly. If your nail infection returns after you stop using this medicine, contact your doctor or health care professional. What side effects may I notice from receiving this medication? Side effects that you should report to your doctor or health care professional as soon as possible: allergic reactions like skin rash, itching or hives, swelling of the face, lips, or tongue ingrown toenail Side effects that usually do not require medical attention (report to your doctor or health care professional if they continue or are bothersome): mild skin irritation, burning, or itching This list may not describe all possible side effects. Call your doctor for medical advice about side effects. You may report side effects to FDA at 1-800-FDA-1088. Where should I keep my medication? Keep out of the   reach of children. Store at room temperature between 20 and 25 degrees C (68 and 77 degrees F). Keep this medicine in the original container. Throw away any unused medicine after the expiration date. This medicine is flammable. Avoid exposure to heat, fire, flame, and smoking. NOTE: This sheet is a summary. It may  not cover all possible information. If you have questions about this medicine, talk to your doctor, pharmacist, or health care provider.  2023 Elsevier/Gold Standard (2019-02-13 00:00:00)  

## 2022-03-09 NOTE — Progress Notes (Signed)
Subjective:   Patient ID: Candace Wells, female   DOB: 71 y.o.   MRN: [id number]   HPI 71 year old female presents the office today for concerns of toenail fungus.  She said the right hallux nail is starting to lift up some and she is noticed yellow discoloration.  She has no pain to the nails.  She tried over-the-counter nail fungus medicine without any improvement.  No edema, erythema or signs of infection she reports.   Review of Systems  All other systems reviewed and are negative.  Past Medical History:  Diagnosis Date   atrial fib    Breast cancer ([place]) 2010   left   Osteoporosis    Palpitations     Past Surgical History:  Procedure Laterality Date   BREAST BIOPSY  02/28/2012   Procedure: BREAST BIOPSY WITH NEEDLE LOCALIZATION;  Surgeon: Rolm Bookbinder, MD;  Location: [facility];  Service: General;  Laterality: Right;  Right breast wire guided biopsy   BREAST RECONSTRUCTION  04/23/2012   Procedure: BREAST RECONSTRUCTION;  Surgeon: Crissie Reese, MD;  Location: [facility];  Service: Plastics;  Laterality: Bilateral;  bilateral breast reconstruction with Tissue Expander placement   BREAST SURGERY  2010   left mastectomy and snbx   DILATION AND CURETTAGE OF UTERUS     TONSILLECTOMY       Current Outpatient Medications:    Efinaconazole 10 % SOLN, Apply 1 drop topically daily., Disp: 4 mL, Rfl: 11   ALPRAZolam (XANAX) 0.5 MG tablet, Take 0.5 tablets by mouth as needed for sleep., Disp: , Rfl:    aspirin 81 MG tablet, Take 81 mg by mouth daily., Disp: , Rfl:    b complex vitamins tablet, Take 1 tablet by mouth daily., Disp: , Rfl:    Calcium Carbonate-Vitamin D (CALCIUM + D PO), Take 1 tablet by mouth daily. , Disp: , Rfl:    fexofenadine (ALLEGRA) 180 MG tablet, Take 180 mg by mouth daily., Disp: , Rfl:    fluticasone (FLONASE) 50 MCG/ACT nasal spray, Place 2 sprays into both nostrils daily as needed for allergies. , Disp: , Rfl:    Multiple  Vitamin (MULTIVITAMIN) capsule, Take 1 capsule by mouth daily., Disp: , Rfl:    propranolol (INDERAL) 20 MG tablet, Take 0.5-1 tablets (10-20 mg total) by mouth 2 (two) times daily as needed (Tachycardia)., Disp: 90 tablet, Rfl: 3   UNABLE TO FIND, as directed. Cres Off, Disp: , Rfl:    Vitamin D, Cholecalciferol, 1000 UNITS CAPS, Take 1 capsule by mouth daily. , Disp: , Rfl:   Allergies  Allergen Reactions   Amitriptyline Palpitations and Other (See Comments)    And racing heart And racing heart   Sulfa Antibiotics Hives   Tetracycline Hives    Patient thinks this is the name of the medication   Tetracyclines & Related Hives   Codeine Nausea And Vomiting and Nausea Only         Objective:  Physical Exam  General: AAO x3, NAD  Dermatological: Bilateral hallux nails are mildly dystrophic and hypertrophic medial discoloration.  There is no edema, erythema or signs of infection.  Mild lifting of the right hallux toenail distally but firmly here for the rest of the toenail.  Vascular: Dorsalis Pedis artery and Posterior Tibial artery pedal pulses are 2/4 bilateral with immedate capillary fill time. There is no pain with calf compression, swelling, warmth, erythema.   Neruologic: Grossly intact via light touch bilateral.   Musculoskeletal: No  gross boney pedal deformities bilateral. No pain, crepitus, or limitation noted with foot and ankle range of motion bilateral. Muscular strength 5/5 in all groups tested bilateral.  Gait: Unassisted, Nonantalgic.       Assessment:   Onychomycosis     Plan:  -Treatment options discussed including all alternatives, risks, and complications -Etiology of symptoms were discussed -Discussed different treatment options for nail fungus including oral, topical as well as alternative treatments.  After discussion was proceed with oral Jublia.  Discussed side effects, success rates as well as duration of use.  Trula Slade DPM

## 2022-03-10 ENCOUNTER — Telehealth: Payer: Self-pay | Admitting: *Deleted

## 2022-03-10 NOTE — Telephone Encounter (Signed)
Percell Locus w/ [employer] calling for an alternate for medication(Jublia) , too expensive,would like to change to [employer], only $60) please advise?

## 2022-03-10 NOTE — Telephone Encounter (Signed)
yes

## 2022-04-25 DIAGNOSIS — H43813 Vitreous degeneration, bilateral: Secondary | ICD-10-CM | POA: Diagnosis not present

## 2022-04-25 DIAGNOSIS — H2513 Age-related nuclear cataract, bilateral: Secondary | ICD-10-CM | POA: Diagnosis not present

## 2022-05-09 DIAGNOSIS — Z0000 Encounter for general adult medical examination without abnormal findings: Secondary | ICD-10-CM | POA: Diagnosis not present

## 2022-05-10 NOTE — Progress Notes (Signed)
PCP:  Lawerance Cruel, MD Primary Cardiologist: None Electrophysiologist: Thompson Grayer, MD   Candace Wells is a 71 y.o. female seen today for Thompson Grayer, MD for routine electrophysiology followup.  Since last being seen in our clinic the patient reports doing well overall. She has palpitations most days. Short term monitoring in the past has shown both NSR and ectopy as her symptoms, previously had brief AF that was considered subclinical/very low burden.  she denies chest pain, dyspnea, PND, orthopnea, nausea, vomiting, dizziness, syncope, edema, weight gain, or early satiety.  Past Medical History:  Diagnosis Date   atrial fib    Breast cancer ([place]) 2010   left   Osteoporosis    Palpitations    Past Surgical History:  Procedure Laterality Date   BREAST BIOPSY  02/28/2012   Procedure: BREAST BIOPSY WITH NEEDLE LOCALIZATION;  Surgeon: Rolm Bookbinder, MD;  Location: [facility];  Service: General;  Laterality: Right;  Right breast wire guided biopsy   BREAST RECONSTRUCTION  04/23/2012   Procedure: BREAST RECONSTRUCTION;  Surgeon: Crissie Reese, MD;  Location: [facility];  Service: Plastics;  Laterality: Bilateral;  bilateral breast reconstruction with Tissue Expander placement   BREAST SURGERY  2010   left mastectomy and snbx   DILATION AND CURETTAGE OF UTERUS     TONSILLECTOMY      Current Outpatient Medications  Medication Sig Dispense Refill   ALPRAZolam (XANAX) 0.5 MG tablet Take 0.5 tablets by mouth as needed for sleep.     aspirin 81 MG tablet Take 81 mg by mouth daily.     b complex vitamins tablet Take 1 tablet by mouth daily.     Calcium Carbonate-Vitamin D (CALCIUM + D PO) Take 1 tablet by mouth daily.      Efinaconazole 10 % SOLN Apply 1 drop topically daily. 4 mL 11   fexofenadine (ALLEGRA) 180 MG tablet Take 180 mg by mouth daily.     fluticasone (FLONASE) 50 MCG/ACT nasal spray Place 2 sprays into both nostrils daily as needed  for allergies.      Multiple Vitamin (MULTIVITAMIN) capsule Take 1 capsule by mouth daily.     propranolol (INDERAL) 20 MG tablet Take 0.5-1 tablets (10-20 mg total) by mouth 2 (two) times daily as needed (Tachycardia). 90 tablet 3   UNABLE TO FIND as directed. Cres Off     Vitamin D, Cholecalciferol, 1000 UNITS CAPS Take 1 capsule by mouth daily.      No current facility-administered medications for this visit.    Allergies  Allergen Reactions   Amitriptyline Palpitations and Other (See Comments)    And racing heart And racing heart   Sulfa Antibiotics Hives   Tetracycline Hives    Patient thinks this is the name of the medication   Tetracyclines & Related Hives   Codeine Nausea And Vomiting and Nausea Only    Social History   Socioeconomic History   Marital status: Married    Spouse name: Not on file   Number of children: 3   Years of education: 12   Highest education level: Not on file  Occupational History   Not on file  Tobacco Use   Smoking status: Never   Smokeless tobacco: Never  Vaping Use   Vaping Use: Never used  Substance and Sexual Activity   Alcohol use: No    Alcohol/week: 0.0 standard drinks of alcohol   Drug use: No   Sexual activity: Not on file  Other Topics Concern   Not on file  Social History Narrative   Patient is married with 3 children.   Patient is right handed.   Patient has hs education.   Patient drinks 2 times a week.   Social Determinants of Health   Financial Resource Strain: Not on file  Food Insecurity: Not on file  Transportation Needs: Not on file  Physical Activity: Not on file  Stress: Not on file  Social Connections: Not on file  Intimate Partner Violence: Not on file     Review of Systems: All other systems reviewed and are otherwise negative except as noted above.  Physical Exam: Vitals:   05/16/22 0957  BP: 114/74  Pulse: 69  SpO2: 98%  Weight: 133 lb 3.2 oz (60.4 kg)  Height: '5\' 4"'$  (1.626 m)    GEN-  The patient is well appearing, alert and oriented x 3 today.   HEENT: normocephalic, atraumatic; sclera clear, conjunctiva pink; hearing intact; oropharynx clear; neck supple, no JVP Lymph- no cervical lymphadenopathy Lungs- Clear to ausculation bilaterally, normal work of breathing.  No wheezes, rales, rhonchi Heart- Regular rate and rhythm, no murmurs, rubs or gallops, PMI not laterally displaced GI- soft, non-tender, non-distended, bowel sounds present, no hepatosplenomegaly Extremities- no clubbing, cyanosis, or edema; DP/PT/radial pulses 2+ bilaterally MS- no significant deformity or atrophy Skin- warm and dry, no rash or lesion Psych- euthymic mood, full affect Neuro- strength and sensation are intact  EKG is ordered. Personal review of EKG from today shows NSR at 69 bpm  Additional studies reviewed include: Previous EP office notes.   Assessment and Plan: 1. Palpitations 2. Subclinical AF EKG today shows NSR H/o sinus tachycardia and felt to have sub-clinical atrial fibrillation CHA2DS2/VASc at least 4 with TIA like symptoms in 2015 without MRI changes.   Discussed with Dr. Curt Bears. Pt was previously covered with [place] but has since declined. Given history of AF and questionable CVA history, but be reasonable to monitor with loop recorder, and pt is agreeable.   Follow up with Dr. Curt Bears in  2-3 months to discuss and implant at same visit.     Shirley Friar, PA-C  05/16/22 10:23 AM

## 2022-05-12 DIAGNOSIS — E559 Vitamin D deficiency, unspecified: Secondary | ICD-10-CM | POA: Diagnosis not present

## 2022-05-12 DIAGNOSIS — E7800 Pure hypercholesterolemia, unspecified: Secondary | ICD-10-CM | POA: Diagnosis not present

## 2022-05-12 DIAGNOSIS — Z131 Encounter for screening for diabetes mellitus: Secondary | ICD-10-CM | POA: Diagnosis not present

## 2022-05-13 DIAGNOSIS — Z01419 Encounter for gynecological examination (general) (routine) without abnormal findings: Secondary | ICD-10-CM | POA: Diagnosis not present

## 2022-05-13 DIAGNOSIS — Z6822 Body mass index (BMI) 22.0-22.9, adult: Secondary | ICD-10-CM | POA: Diagnosis not present

## 2022-05-16 ENCOUNTER — Ambulatory Visit: Payer: Medicare HMO | Admitting: Student

## 2022-05-16 ENCOUNTER — Encounter: Payer: Self-pay | Admitting: Student

## 2022-05-16 VITALS — BP 114/74 | HR 69 | Ht 64.0 in | Wt 133.2 lb

## 2022-05-16 DIAGNOSIS — I480 Paroxysmal atrial fibrillation: Secondary | ICD-10-CM

## 2022-05-16 DIAGNOSIS — G459 Transient cerebral ischemic attack, unspecified: Secondary | ICD-10-CM | POA: Diagnosis not present

## 2022-05-16 NOTE — Patient Instructions (Signed)
Medication Instructions:  Your physician recommends that you continue on your current medications as directed. Please refer to the Current Medication list given to you today.  *If you need a refill on your cardiac medications before your next appointment, please call your pharmacy*   Lab Work: None If you have labs (blood work) drawn today and your tests are completely normal, you will receive your results only by: [place] (if you have MyChart) OR A paper copy in the mail If you have any lab test that is abnormal or we need to change your treatment, we will call you to review the results.   Follow-Up: At [facility], you and your health needs are our priority.  As part of our continuing mission to provide you with exceptional heart care, we have created designated Provider Care Teams.  These Care Teams include your primary Cardiologist (physician) and Advanced Practice Providers (APPs -  Physician Assistants and Nurse Practitioners) who all work together to provide you with the care you need, when you need it.   Your next appointment:   3 month(s) to discuss Loop Implant  The format for your next appointment:   In Person  Provider:   Allegra Lai, MD

## 2022-06-27 ENCOUNTER — Ambulatory Visit: Payer: Medicare HMO | Admitting: Podiatry

## 2022-06-27 DIAGNOSIS — B351 Tinea unguium: Secondary | ICD-10-CM | POA: Diagnosis not present

## 2022-06-27 DIAGNOSIS — M79675 Pain in left toe(s): Secondary | ICD-10-CM

## 2022-06-27 DIAGNOSIS — M79674 Pain in right toe(s): Secondary | ICD-10-CM | POA: Diagnosis not present

## 2022-06-27 NOTE — Progress Notes (Signed)
   Chief Complaint  Patient presents with   Ingrown Toenail    Right foot ingrown toe nail right foot, left foot toe nail fungus left foot 2nd toe.and third toe    HPI: 71 y.o. female presenting today as a new patient for evaluation of symptomatic toenails to the bilateral feet. Patient is concerned for possible ingrown toenail as well. She would like to have her feet evaluated today. Only mildly symptomatic.   Past Medical History:  Diagnosis Date   atrial fib    Breast cancer ([place]) 2010   left   Osteoporosis    Palpitations     Past Surgical History:  Procedure Laterality Date   BREAST BIOPSY  02/28/2012   Procedure: BREAST BIOPSY WITH NEEDLE LOCALIZATION;  Surgeon: Rolm Bookbinder, MD;  Location: [facility];  Service: General;  Laterality: Right;  Right breast wire guided biopsy   BREAST RECONSTRUCTION  04/23/2012   Procedure: BREAST RECONSTRUCTION;  Surgeon: Crissie Reese, MD;  Location: [facility];  Service: Plastics;  Laterality: Bilateral;  bilateral breast reconstruction with Tissue Expander placement   BREAST SURGERY  2010   left mastectomy and snbx   DILATION AND CURETTAGE OF UTERUS     TONSILLECTOMY      Allergies  Allergen Reactions   Amitriptyline Palpitations and Other (See Comments)    And racing heart And racing heart   Sulfa Antibiotics Hives   Tetracycline Hives    Patient thinks this is the name of the medication   Tetracyclines & Related Hives   Codeine Nausea And Vomiting and Nausea Only     Physical Exam: General: The patient is alert and oriented x3 in no acute distress.  Dermatology: Skin is warm, dry and supple bilateral lower extremities.  There is some very mild hyperkeratotic nails noted to the bilateral nail plates of the feet  Vascular: Palpable pedal pulses bilaterally. Capillary refill within normal limits.  Negative for any significant edema or erythema  Neurological: Light touch and protective threshold  grossly intact  Musculoskeletal Exam: No pedal deformities noted  Assessment: 1.  Pain due to onychomycosis of toenails both   Plan of Care:  1. Patient evaluated. 2.  Light mechanical debridement of the nails was performed using a nail nipper without incident or bleeding 3.  Continue to maintain good foot hygiene and routine foot care 4.  Recommend good supportive shoes and sneakers 5.  Return to clinic as needed      Edrick Kins, DPM [facility]  Dr. Edrick Kins, DPM    [address], [place] [address]                Office [phone]  Fax [phone]

## 2022-06-27 NOTE — Addendum Note (Signed)
Addended by: Edrick Kins on: 06/27/2022 06:44 PM   Modules accepted: Level of Service

## 2022-07-08 DIAGNOSIS — Z0000 Encounter for general adult medical examination without abnormal findings: Secondary | ICD-10-CM | POA: Diagnosis not present

## 2022-07-08 DIAGNOSIS — Z6821 Body mass index (BMI) 21.0-21.9, adult: Secondary | ICD-10-CM | POA: Diagnosis not present

## 2022-07-08 DIAGNOSIS — M810 Age-related osteoporosis without current pathological fracture: Secondary | ICD-10-CM | POA: Diagnosis not present

## 2022-07-11 ENCOUNTER — Other Ambulatory Visit (HOSPITAL_BASED_OUTPATIENT_CLINIC_OR_DEPARTMENT_OTHER): Payer: Self-pay | Admitting: Family Medicine

## 2022-07-11 ENCOUNTER — Other Ambulatory Visit: Payer: Self-pay | Admitting: Family Medicine

## 2022-07-11 DIAGNOSIS — Z0000 Encounter for general adult medical examination without abnormal findings: Secondary | ICD-10-CM

## 2022-07-19 ENCOUNTER — Other Ambulatory Visit: Payer: Self-pay | Admitting: Podiatry

## 2022-08-15 ENCOUNTER — Ambulatory Visit: Payer: Medicare HMO | Admitting: Cardiology

## 2022-08-16 ENCOUNTER — Encounter (HOSPITAL_BASED_OUTPATIENT_CLINIC_OR_DEPARTMENT_OTHER): Payer: Self-pay

## 2022-08-16 ENCOUNTER — Ambulatory Visit (HOSPITAL_BASED_OUTPATIENT_CLINIC_OR_DEPARTMENT_OTHER)
Admission: RE | Admit: 2022-08-16 | Discharge: 2022-08-16 | Disposition: A | Discharge status: Home / Self Care | Payer: Medicare HMO | Source: Ambulatory Visit | Attending: Family Medicine | Admitting: Family Medicine

## 2022-08-16 DIAGNOSIS — Z0000 Encounter for general adult medical examination without abnormal findings: Secondary | ICD-10-CM | POA: Insufficient documentation

## 2022-10-06 ENCOUNTER — Other Ambulatory Visit: Payer: Self-pay | Admitting: Student

## 2022-11-08 DIAGNOSIS — R109 Unspecified abdominal pain: Secondary | ICD-10-CM | POA: Diagnosis not present

## 2022-11-08 DIAGNOSIS — R1032 Left lower quadrant pain: Secondary | ICD-10-CM | POA: Diagnosis not present

## 2022-11-21 DIAGNOSIS — R1032 Left lower quadrant pain: Secondary | ICD-10-CM | POA: Diagnosis not present

## 2023-02-27 NOTE — Progress Notes (Unsigned)
  Electrophysiology Office Note:   Date:  02/28/2023  ID:  Candace Wells, DOB [birth year], MRN [id number]  Primary Cardiologist: None Electrophysiologist: Hillis Range, MD (Inactive)  -> Dr. Elberta Fortis  History of Present Illness:   Candace Wells is a 72 y.o. female with h/o palpitations and subclinical AF seen today for routine electrophysiology followup. Since last being seen in our clinic the patient reports doing very well. She has rare palpitations, but is otherwise without complaint. She denies chest pain, dyspnea, PND, orthopnea, nausea, vomiting, dizziness, syncope, edema, weight gain, or early satiety.   Review of systems complete and found to be negative unless listed in HPI.   Studies Reviewed:    EKG is ordered today. Personal review shows NSR at 78 bpm   Physical Exam:   VS:  BP 102/72   Pulse 77   Ht 5\' 4"  (1.626 m)   Wt 133 lb 9.6 oz (60.6 kg)   SpO2 98%   BMI 22.93 kg/m    Wt Readings from Last 3 Encounters:  02/28/23 133 lb 9.6 oz (60.6 kg)  05/16/22 133 lb 3.2 oz (60.4 kg)  05/10/21 123 lb (55.8 kg)     GEN: Well nourished, well developed in no acute distress NECK: No JVD; No carotid bruits CARDIAC: Regular rate and rhythm, no murmurs, rubs, gallops RESPIRATORY:  Clear to auscultation without rales, wheezing or rhonchi  ABDOMEN: Soft, non-tender, non-distended EXTREMITIES:  No edema; No deformity   ASSESSMENT AND PLAN:    Palpitations Subclinical AF  bpm EKG today shows NSR at 78 bpm H/o sinus tachycardia and felt to have sub-clinical AF.  Her CHA2DS2VASc is at least 4, with TIA like symptoms in 2015 without MRI changes We have previously discussed loop recorder in the setting of her refusing OAC.  She is currently using an apple watch to monitor for arrhythmias.   H/o TIA Discuss for cardiac monitoring as above.   Follow up with Dr. Elberta Fortis in 12 months to establish from Dr. Johney Frame  Signed, Graciella Freer, PA-C

## 2023-02-28 ENCOUNTER — Ambulatory Visit: Payer: Medicare HMO | Attending: Student | Admitting: Student

## 2023-02-28 ENCOUNTER — Encounter: Payer: Self-pay | Admitting: Student

## 2023-02-28 VITALS — BP 102/72 | HR 77 | Ht 64.0 in | Wt 133.6 lb

## 2023-02-28 DIAGNOSIS — G459 Transient cerebral ischemic attack, unspecified: Secondary | ICD-10-CM

## 2023-02-28 DIAGNOSIS — R002 Palpitations: Secondary | ICD-10-CM

## 2023-02-28 DIAGNOSIS — I480 Paroxysmal atrial fibrillation: Secondary | ICD-10-CM | POA: Diagnosis not present

## 2023-02-28 MED ORDER — PROPRANOLOL HCL 20 MG PO TABS: ORAL_TABLET | ORAL | 3 refills | Status: DC

## 2023-02-28 NOTE — Patient Instructions (Signed)
Medication Instructions:  Your physician recommends that you continue on your current medications as directed. Please refer to the Current Medication list given to you today.  *If you need a refill on your cardiac medications before your next appointment, please call your pharmacy*  Lab Work: None ordered If you have labs (blood work) drawn today and your tests are completely normal, you will receive your results only by: MyChart Message (if you have MyChart) OR A paper copy in the mail If you have any lab test that is abnormal or we need to change your treatment, we will call you to review the results.  Follow-Up: At [facility], you and your health needs are our priority.  As part of our continuing mission to provide you with exceptional heart care, we have created designated Provider Care Teams.  These Care Teams include your primary Cardiologist (physician) and Advanced Practice Providers (APPs -  Physician Assistants and Nurse Practitioners) who all work together to provide you with the care you need, when you need it.  Your next appointment:   1 year(s)  Provider:   Will Camnitz, MD  

## 2023-04-28 DIAGNOSIS — H2513 Age-related nuclear cataract, bilateral: Secondary | ICD-10-CM | POA: Diagnosis not present

## 2023-07-24 DIAGNOSIS — D6869 Other thrombophilia: Secondary | ICD-10-CM | POA: Diagnosis not present

## 2023-07-24 DIAGNOSIS — E559 Vitamin D deficiency, unspecified: Secondary | ICD-10-CM | POA: Diagnosis not present

## 2023-07-24 DIAGNOSIS — I480 Paroxysmal atrial fibrillation: Secondary | ICD-10-CM | POA: Diagnosis not present

## 2023-07-24 DIAGNOSIS — E7800 Pure hypercholesterolemia, unspecified: Secondary | ICD-10-CM | POA: Diagnosis not present

## 2023-08-07 DIAGNOSIS — Z1151 Encounter for screening for human papillomavirus (HPV): Secondary | ICD-10-CM | POA: Diagnosis not present

## 2023-08-07 DIAGNOSIS — Z6823 Body mass index (BMI) 23.0-23.9, adult: Secondary | ICD-10-CM | POA: Diagnosis not present

## 2023-08-07 DIAGNOSIS — Z01419 Encounter for gynecological examination (general) (routine) without abnormal findings: Secondary | ICD-10-CM | POA: Diagnosis not present

## 2023-08-07 DIAGNOSIS — Z124 Encounter for screening for malignant neoplasm of cervix: Secondary | ICD-10-CM | POA: Diagnosis not present

## 2023-11-09 DIAGNOSIS — R519 Headache, unspecified: Secondary | ICD-10-CM | POA: Diagnosis not present

## 2023-11-09 DIAGNOSIS — G454 Transient global amnesia: Secondary | ICD-10-CM | POA: Diagnosis not present

## 2023-11-15 ENCOUNTER — Other Ambulatory Visit: Payer: Self-pay | Admitting: Family Medicine

## 2023-11-15 DIAGNOSIS — R519 Headache, unspecified: Secondary | ICD-10-CM

## 2023-11-15 DIAGNOSIS — G454 Transient global amnesia: Secondary | ICD-10-CM

## 2023-12-19 ENCOUNTER — Ambulatory Visit
Admission: RE | Admit: 2023-12-19 | Discharge: 2023-12-19 | Disposition: A | Discharge status: Home / Self Care | Payer: Medicare HMO | Source: Ambulatory Visit | Attending: Family Medicine | Admitting: Family Medicine

## 2023-12-19 DIAGNOSIS — G454 Transient global amnesia: Secondary | ICD-10-CM

## 2023-12-19 DIAGNOSIS — R519 Headache, unspecified: Secondary | ICD-10-CM

## 2023-12-19 MED ORDER — GADOPICLENOL 0.5 MMOL/ML IV SOLN
6.0000 mL | Freq: Once | INTRAVENOUS | Status: AC | PRN
  Administered 2023-12-19: 6 mL via INTRAVENOUS

## 2023-12-20 ENCOUNTER — Encounter: Payer: Self-pay | Admitting: Genetic Counselor

## 2024-02-12 ENCOUNTER — Other Ambulatory Visit: Payer: Self-pay | Admitting: Student

## 2024-04-03 ENCOUNTER — Encounter: Payer: Self-pay | Admitting: Cardiology

## 2024-04-03 ENCOUNTER — Ambulatory Visit: Attending: Cardiology | Admitting: Cardiology

## 2024-04-03 VITALS — BP 103/64 | HR 80 | Ht 64.0 in | Wt 125.0 lb

## 2024-04-03 DIAGNOSIS — I480 Paroxysmal atrial fibrillation: Secondary | ICD-10-CM | POA: Diagnosis not present

## 2024-04-03 DIAGNOSIS — R002 Palpitations: Secondary | ICD-10-CM

## 2024-04-03 NOTE — Progress Notes (Signed)
  Electrophysiology Office Note:   Date:  04/03/2024  ID:  Candace Wells, DOB 04-May-1951, MRN [id number]  Primary Cardiologist: None Primary Heart Failure: None Electrophysiologist: Jolly Needle, MD (Inactive)      History of Present Illness:   Candace Wells is a 73 y.o. female with h/o atrial fibrillation seen today for routine electrophysiology followup.   Since last being seen in our clinic the patient reports doing overall well.  She has no acute complaints at this time.  She has intermittent palpitations that are improved with taking propranolol .  She does at times get dizzy when standing.  She is reduced her propranolol  to daily dosing in the evening.  This has helped with her symptoms..  she denies chest pain, palpitations, dyspnea, PND, orthopnea, nausea, vomiting, dizziness, syncope, edema, weight gain, or early satiety.   Review of systems complete and found to be negative unless listed in HPI.   EP Information / Studies Reviewed:    EKG is ordered today. Personal review as below. Sinus rhythm  Risk Assessment/Calculations:    CHA2DS2-VASc Score = 4   This indicates a 4.8% annual risk of stroke. The patient's score is based upon:       Physical Exam:   VS:  BP 103/64 (BP Location: Right Arm, Patient Position: Sitting, Cuff Size: Normal)   Pulse 80   Ht 5' 4 (1.626 m)   Wt 125 lb (56.7 kg)   SpO2 97%   BMI 21.46 kg/m    Wt Readings from Last 3 Encounters:  04/03/24 125 lb (56.7 kg)  02/28/23 133 lb 9.6 oz (60.6 kg)  05/16/22 133 lb 3.2 oz (60.4 kg)     GEN: Well nourished, well developed in no acute distress NECK: No JVD; No carotid bruits CARDIAC: Regular rate and rhythm, no murmurs, rubs, gallops RESPIRATORY:  Clear to auscultation without rales, wheezing or rhonchi  ABDOMEN: Soft, non-tender, non-distended EXTREMITIES:  No edema; No deformity   ASSESSMENT AND PLAN:    1.  Subclinical atrial fibrillation: Patient has not anticoagulated.  She is  refusing at this time.  Using an Apple Watch to monitor for atrial fibrillation burden.  She takes propranolol .  This improves her symptoms.  Candace Wells continue with current management.  2.  Secondary hypercoagulable state: Not anticoagulated due to patient wishes  Follow up with EP APP in 12 months  Signed, Tatem Fesler [place] Ding, MD

## 2024-04-08 ENCOUNTER — Other Ambulatory Visit: Payer: Self-pay | Admitting: Student

## 2024-04-10 ENCOUNTER — Ambulatory Visit: Admitting: Podiatry

## 2024-04-10 ENCOUNTER — Encounter: Payer: Self-pay | Admitting: Podiatry

## 2024-04-10 ENCOUNTER — Ambulatory Visit (INDEPENDENT_AMBULATORY_CARE_PROVIDER_SITE_OTHER)

## 2024-04-10 VITALS — Ht 64.0 in | Wt 125.0 lb

## 2024-04-10 DIAGNOSIS — M7742 Metatarsalgia, left foot: Secondary | ICD-10-CM | POA: Diagnosis not present

## 2024-04-10 DIAGNOSIS — M778 Other enthesopathies, not elsewhere classified: Secondary | ICD-10-CM

## 2024-04-10 NOTE — Progress Notes (Signed)
   Chief Complaint  Patient presents with   Foot Pain    Pt is here due to left foot pain to the bottom of foot states the pain comes and goes tingling sensation for about 6 weeks or so has pain especially when she wears certain shoes.    HPI: 73 y.o. female presenting today for evaluation of left forefoot pain.  Patient states that she has a history of high arches which can exacerbate pain to the forefoot.  She does not go barefoot.  No recent history of injury  Past Medical History:  Diagnosis Date   atrial fib    Breast cancer (HCC) 2010   left   Osteoporosis    Palpitations     Past Surgical History:  Procedure Laterality Date   BREAST BIOPSY  02/28/2012   Procedure: BREAST BIOPSY WITH NEEDLE LOCALIZATION;  Surgeon: Donnice Bury, MD;  Location: [facility];  Service: General;  Laterality: Right;  Right breast wire guided biopsy   BREAST RECONSTRUCTION  04/23/2012   Procedure: BREAST RECONSTRUCTION;  Surgeon: Alm Sick, MD;  Location: [facility];  Service: Plastics;  Laterality: Bilateral;  bilateral breast reconstruction with Tissue Expander placement   BREAST SURGERY  2010   left mastectomy and snbx   DILATION AND CURETTAGE OF UTERUS     TONSILLECTOMY      Allergies  Allergen Reactions   Amitriptyline  Palpitations and Other (See Comments)    And racing heart And racing heart   Sulfa Antibiotics Hives   Tetracycline Hives    Patient thinks this is the name of the medication   Tetracyclines & Related Hives   Codeine Nausea And Vomiting and Nausea Only     Physical Exam: General: The patient is alert and oriented x3 in no acute distress.  Dermatology: Skin is warm, dry and supple bilateral lower extremities.   Vascular: Palpable pedal pulses bilaterally. Capillary refill within normal limits.  No appreciable edema.  No erythema.  Neurological: Grossly intact via light touch  Musculoskeletal Exam: Mild diffuse tenderness throughout  palpation to the forefoot left  Radiographic Exam LT foot 04/10/2024:  Normal osseous mineralization. Joint spaces preserved.  No fractures or osseous irregularities noted.  Foreign body noted to the plantar aspect of the left heel approximately 6 mm in length.  Clinically this area is completely asymptomatic  Assessment/Plan of Care: 1.  Metatarsalgia left foot  -Patient evaluated.  X-rays reviewed -The foreign body to the plantar heel of the left foot visualized on x-ray is completely asymptomatic.  No history of injury.  Will simply observe for now -Offloading felt metatarsal pads were applied to the sandals of the patient's shoes to offload pressure from the forefoot and support the arch.  Patient did feel some relief with this -Recommend conservative care including good supportive tennis shoes with arch support -Return to clinic PRN       Thresa EMERSON Sar, DPM [facility]  Dr. Thresa EMERSON Sar, DPM    [address] [place], [place] [address]                Office [phone]  Fax [phone]

## 2024-04-29 DIAGNOSIS — H25813 Combined forms of age-related cataract, bilateral: Secondary | ICD-10-CM | POA: Diagnosis not present

## 2024-05-28 DIAGNOSIS — Z86018 Personal history of other benign neoplasm: Secondary | ICD-10-CM | POA: Diagnosis not present

## 2024-06-27 ENCOUNTER — Other Ambulatory Visit: Payer: Self-pay | Admitting: Student

## 2024-07-15 DIAGNOSIS — K5730 Diverticulosis of large intestine without perforation or abscess without bleeding: Secondary | ICD-10-CM | POA: Diagnosis not present

## 2024-07-15 DIAGNOSIS — K648 Other hemorrhoids: Secondary | ICD-10-CM | POA: Diagnosis not present

## 2024-07-15 DIAGNOSIS — Z860101 Personal history of adenomatous and serrated colon polyps: Secondary | ICD-10-CM | POA: Diagnosis not present

## 2024-07-15 DIAGNOSIS — Z09 Encounter for follow-up examination after completed treatment for conditions other than malignant neoplasm: Secondary | ICD-10-CM | POA: Diagnosis not present

## 2024-07-15 DIAGNOSIS — D123 Benign neoplasm of transverse colon: Secondary | ICD-10-CM | POA: Diagnosis not present

## 2024-07-17 DIAGNOSIS — D123 Benign neoplasm of transverse colon: Secondary | ICD-10-CM | POA: Diagnosis not present

## 2024-07-22 ENCOUNTER — Ambulatory Visit: Admitting: Podiatry

## 2024-08-05 ENCOUNTER — Encounter: Payer: Self-pay | Admitting: Family Medicine

## 2024-08-05 DIAGNOSIS — C50912 Malignant neoplasm of unspecified site of left female breast: Secondary | ICD-10-CM | POA: Diagnosis not present

## 2024-08-05 DIAGNOSIS — I479 Paroxysmal tachycardia, unspecified: Secondary | ICD-10-CM | POA: Diagnosis not present

## 2024-08-05 DIAGNOSIS — F430 Acute stress reaction: Secondary | ICD-10-CM | POA: Diagnosis not present

## 2024-08-05 DIAGNOSIS — F411 Generalized anxiety disorder: Secondary | ICD-10-CM | POA: Diagnosis not present

## 2024-08-05 DIAGNOSIS — Z0001 Encounter for general adult medical examination with abnormal findings: Secondary | ICD-10-CM | POA: Diagnosis not present

## 2024-08-05 DIAGNOSIS — I480 Paroxysmal atrial fibrillation: Secondary | ICD-10-CM | POA: Diagnosis not present

## 2024-08-05 DIAGNOSIS — E785 Hyperlipidemia, unspecified: Secondary | ICD-10-CM | POA: Diagnosis not present

## 2024-09-24 DIAGNOSIS — Z803 Family history of malignant neoplasm of breast: Secondary | ICD-10-CM | POA: Diagnosis not present

## 2024-09-24 DIAGNOSIS — M816 Localized osteoporosis [Lequesne]: Secondary | ICD-10-CM | POA: Diagnosis not present
